# Patient Record
Sex: Female | Born: 1955 | Hispanic: Refuse to answer | Marital: Single | Smoking: Never smoker
Health system: Southern US, Community
[De-identification: ages and names within clinical notes are randomized; demographics above are authoritative.]

## PROBLEM LIST (undated history)

## (undated) DIAGNOSIS — E785 Hyperlipidemia, unspecified: Secondary | ICD-10-CM

## (undated) DIAGNOSIS — M67479 Ganglion, unspecified ankle and foot: Secondary | ICD-10-CM

## (undated) DIAGNOSIS — Z98811 Dental restoration status: Secondary | ICD-10-CM

## (undated) DIAGNOSIS — M8580 Other specified disorders of bone density and structure, unspecified site: Secondary | ICD-10-CM

## (undated) DIAGNOSIS — I739 Peripheral vascular disease, unspecified: Secondary | ICD-10-CM

## (undated) HISTORY — PX: TONSILLECTOMY AND ADENOIDECTOMY: SUR1326

## (undated) HISTORY — DX: Other specified disorders of bone density and structure, unspecified site: M85.80

## (undated) HISTORY — PX: TOE SURGERY: SHX1073

## (undated) HISTORY — PX: PELVIC LAPAROSCOPY: SHX162

---

## 1992-01-23 HISTORY — PX: MYOMECTOMY: SHX85

## 1997-07-23 ENCOUNTER — Ambulatory Visit (HOSPITAL_COMMUNITY): Admission: RE | Admit: 1997-07-23 | Discharge: 1997-07-23 | Payer: Self-pay | Admitting: Obstetrics and Gynecology

## 1999-10-05 ENCOUNTER — Ambulatory Visit (HOSPITAL_COMMUNITY): Admission: RE | Admit: 1999-10-05 | Discharge: 1999-10-05 | Payer: Self-pay | Admitting: Gynecology

## 1999-10-05 ENCOUNTER — Other Ambulatory Visit: Admission: RE | Admit: 1999-10-05 | Discharge: 1999-10-05 | Payer: Self-pay | Admitting: Gynecology

## 1999-10-05 ENCOUNTER — Encounter: Payer: Self-pay | Admitting: Gynecology

## 2004-11-24 ENCOUNTER — Other Ambulatory Visit: Admission: RE | Admit: 2004-11-24 | Discharge: 2004-11-24 | Payer: Self-pay | Admitting: Obstetrics and Gynecology

## 2004-11-24 ENCOUNTER — Ambulatory Visit (HOSPITAL_COMMUNITY): Admission: RE | Admit: 2004-11-24 | Discharge: 2004-11-24 | Payer: Self-pay | Admitting: Obstetrics and Gynecology

## 2007-01-14 ENCOUNTER — Other Ambulatory Visit: Admission: RE | Admit: 2007-01-14 | Discharge: 2007-01-14 | Payer: Self-pay | Admitting: Obstetrics and Gynecology

## 2008-09-09 ENCOUNTER — Ambulatory Visit (HOSPITAL_COMMUNITY): Admission: RE | Admit: 2008-09-09 | Discharge: 2008-09-09 | Payer: Self-pay | Admitting: General Surgery

## 2008-09-30 ENCOUNTER — Ambulatory Visit: Payer: Self-pay | Admitting: Obstetrics and Gynecology

## 2008-09-30 ENCOUNTER — Encounter: Payer: Self-pay | Admitting: Obstetrics and Gynecology

## 2008-09-30 ENCOUNTER — Other Ambulatory Visit: Admission: RE | Admit: 2008-09-30 | Discharge: 2008-09-30 | Payer: Self-pay | Admitting: Obstetrics and Gynecology

## 2008-10-19 ENCOUNTER — Ambulatory Visit: Payer: Self-pay | Admitting: Gynecology

## 2010-02-13 ENCOUNTER — Encounter: Payer: Self-pay | Admitting: Obstetrics and Gynecology

## 2010-04-06 ENCOUNTER — Ambulatory Visit (HOSPITAL_COMMUNITY)
Admission: RE | Admit: 2010-04-06 | Discharge: 2010-04-06 | Disposition: A | Discharge status: Home / Self Care | Payer: BC Managed Care – PPO | Source: Ambulatory Visit | Attending: Obstetrics and Gynecology | Admitting: Obstetrics and Gynecology

## 2010-04-06 ENCOUNTER — Other Ambulatory Visit: Payer: Self-pay | Admitting: Obstetrics and Gynecology

## 2010-04-06 DIAGNOSIS — Z1231 Encounter for screening mammogram for malignant neoplasm of breast: Secondary | ICD-10-CM

## 2010-04-21 ENCOUNTER — Encounter (INDEPENDENT_AMBULATORY_CARE_PROVIDER_SITE_OTHER): Payer: BC Managed Care – PPO | Admitting: Obstetrics and Gynecology

## 2010-04-21 ENCOUNTER — Other Ambulatory Visit: Payer: Self-pay | Admitting: Obstetrics and Gynecology

## 2010-04-21 ENCOUNTER — Other Ambulatory Visit (HOSPITAL_COMMUNITY)
Admission: RE | Admit: 2010-04-21 | Discharge: 2010-04-21 | Disposition: A | Discharge status: Home / Self Care | Payer: BC Managed Care – PPO | Source: Ambulatory Visit | Attending: Obstetrics and Gynecology | Admitting: Obstetrics and Gynecology

## 2010-04-21 DIAGNOSIS — Z01419 Encounter for gynecological examination (general) (routine) without abnormal findings: Secondary | ICD-10-CM

## 2010-04-21 DIAGNOSIS — Z124 Encounter for screening for malignant neoplasm of cervix: Secondary | ICD-10-CM | POA: Insufficient documentation

## 2010-08-21 ENCOUNTER — Telehealth: Payer: Self-pay

## 2010-08-21 NOTE — Telephone Encounter (Signed)
Tell patient that she should take her Provera every month. I would like her to started tomorrow which is August 1. She should let me know she has abnormal bleeding again.

## 2010-08-21 NOTE — Telephone Encounter (Signed)
SEE NURSE NOTE OF 06/30/10. PT. HAS NOT TAKEN PROVERA RX SINCE MAY 2012. STARTED LIGHT SPOTTING 08/19/10 TO LIGHT FLOW TODAY ON HER OWN. WANTS TO KNOW IF SHE STILL NEEDS TO TAKE PROVERA AT THE BEGINNING OF AUGUST? FYI STATES SHE JUST FORGOT TO TAKE IT IN July. WHEN SHE WAS DUE TO.

## 2010-08-22 NOTE — Telephone Encounter (Signed)
PT. NOTIFIED OF NOTE FROM DR. GOTTSEGEN.

## 2010-09-18 ENCOUNTER — Telehealth: Payer: Self-pay

## 2010-09-18 NOTE — Telephone Encounter (Signed)
PLEASE REFER TO TELEPHONE ENCOUNTER OF 08/21/10. PT STATES SHE STARTED BLEEDING AGAIN ON HER ON 09/15/10 AFTER OUR OFFICE CLOSED & IS CONTINUING ON THRU TODAY. THIS TIME IT IS A MILD FLOW.SHE IS DUE TO TAKE PROGESTERONE AGAIN 09/23/10 & YOU WANTED HER TO CALL IF SHE HAD ANYMORE ABNORMAL BLEEDING. DID PROGESTERONE RX LAST MONTH 08/23/10 FOR APPROXIMATELY 10 DAYS.

## 2010-09-18 NOTE — Telephone Encounter (Signed)
Tell patient to stop the progesterone. I also need to see her so schedule an office visit.

## 2010-09-18 NOTE — Telephone Encounter (Signed)
Pt. Notified by work Engineer, technical sales since [employer] is full, of Dr. Timoteo Expose note below.

## 2010-10-09 ENCOUNTER — Encounter: Payer: Self-pay | Admitting: Gynecology

## 2010-10-16 ENCOUNTER — Encounter: Payer: Self-pay | Admitting: Obstetrics and Gynecology

## 2010-10-16 ENCOUNTER — Ambulatory Visit (INDEPENDENT_AMBULATORY_CARE_PROVIDER_SITE_OTHER): Payer: BC Managed Care – PPO | Admitting: Obstetrics and Gynecology

## 2010-10-16 DIAGNOSIS — N950 Postmenopausal bleeding: Secondary | ICD-10-CM

## 2010-10-16 NOTE — Progress Notes (Signed)
Patient came to see me today with abnormal postmenopausal bleeding. She has been on HRT since the beginning of the year. Initially she had monthly withdrawal period. However since late July she's had persistent spotting.  Pelvic exam: External within normal limits, BUS is within normal limits, vaginal exam within normal limits, cervix is clean, uterus is normal size and shape, adnexa is within normal limits.  Assessment: Postmenopausal bleeding on HRT, irregular  Plan: Endometrial biopsy done with significant tissue obtained. SIH scheduled.

## 2010-11-15 ENCOUNTER — Telehealth: Payer: Self-pay

## 2010-11-15 NOTE — Telephone Encounter (Signed)
I still think she should come in for [facility] GM. I am fine with her stopping HRT for the winter months.

## 2010-11-15 NOTE — Telephone Encounter (Signed)
At last office visit pt evaluated for PMB on HRT.  You recommended [facility].  Pt has that scheduled for Nov. 1 but said that she has not had any spotting since that episode and she wants to know if maybe she could skip the [facility].  Also, patient asked if it would be okay if she went off HRT for the winter months and see if she still has hotflashes.

## 2010-11-15 NOTE — Telephone Encounter (Signed)
Patient advised and she will keep her [facility] appt.

## 2010-11-23 ENCOUNTER — Ambulatory Visit (INDEPENDENT_AMBULATORY_CARE_PROVIDER_SITE_OTHER): Payer: BC Managed Care – PPO | Admitting: Obstetrics and Gynecology

## 2010-11-23 ENCOUNTER — Other Ambulatory Visit: Payer: Self-pay | Admitting: Obstetrics and Gynecology

## 2010-11-23 ENCOUNTER — Ambulatory Visit (INDEPENDENT_AMBULATORY_CARE_PROVIDER_SITE_OTHER): Payer: BC Managed Care – PPO

## 2010-11-23 DIAGNOSIS — D259 Leiomyoma of uterus, unspecified: Secondary | ICD-10-CM

## 2010-11-23 DIAGNOSIS — D219 Benign neoplasm of connective and other soft tissue, unspecified: Secondary | ICD-10-CM

## 2010-11-23 DIAGNOSIS — Z780 Asymptomatic menopausal state: Secondary | ICD-10-CM

## 2010-11-23 DIAGNOSIS — D251 Intramural leiomyoma of uterus: Secondary | ICD-10-CM

## 2010-11-23 DIAGNOSIS — N950 Postmenopausal bleeding: Secondary | ICD-10-CM

## 2010-11-23 DIAGNOSIS — N9489 Other specified conditions associated with female genital organs and menstrual cycle: Secondary | ICD-10-CM

## 2010-11-23 DIAGNOSIS — N7013 Chronic salpingitis and oophoritis: Secondary | ICD-10-CM

## 2010-11-23 DIAGNOSIS — R1904 Left lower quadrant abdominal swelling, mass and lump: Secondary | ICD-10-CM

## 2010-11-23 DIAGNOSIS — N951 Menopausal and female climacteric states: Secondary | ICD-10-CM

## 2010-11-23 NOTE — Progress Notes (Signed)
Patient came in today for an SIH. She had inappropriate postmenopausal bleeding on HRT. She is stopped her progestin for 2 months. She's had no more bleeding. She had a normal endometrial biopsy here on her last visit.  Assessment: Postmenopausal bleeding. Fibroids. Left adnexal mass. Menopausal symptoms.  Plan: We first proceeded with an ultrasound. Her uterus shows multiple fibroids. Please see report for dimensions. There is one anterior which is close to the endometrial cavity. Her endometrial echo is 5.4 mm. Her right ovary is within normal limits. Her left ovary has a thick walled cystic mass which is tubular shaped and is negative for PFD. Her cul-de-sac is free of fluid. When saline was introduced you could see compression from the anterior myoma without any intrauterine cavity defect. 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 We have discussed the findings in detail. I suspect her fibroids caused the bleeding with the stimulation from the HRT. She is currently not symptomatic. She will stop her HRT until it gets warm out. She knows when she restarts it she needs to take her progestin every month. She will return in 3 months for ultrasound of her pelvis for stability of what I think is a hydrosalpinx.

## 2011-02-28 ENCOUNTER — Telehealth: Payer: Self-pay | Admitting: *Deleted

## 2011-02-28 DIAGNOSIS — N7011 Chronic salpingitis: Secondary | ICD-10-CM

## 2011-02-28 NOTE — Telephone Encounter (Signed)
Pt called stating she is noticing that her hot flashes are coming back. She was taking estradiol 1 mg daily and provera 5 mg day 1-12 which she stopped for winter months to see if she still has hot flashes per 11/15/10 office note. Pt is possibly wanting to start back on HRT, she wanted to know if a lower dose would be need because hot flashes are not terrible bad? Pt also wanted to know if she need to take provera as well? Please advise

## 2011-02-28 NOTE — Telephone Encounter (Signed)
Take estradiol 0.5 mgm daily and Provera 5 mgm day 1-12. Start estradiol now and start Provera on march 1. Also due for follow up ultrasound. Please schedule.

## 2011-03-01 MED ORDER — MEDROXYPROGESTERONE ACETATE 5 MG PO TABS: ORAL_TABLET | ORAL | Status: DC

## 2011-03-01 NOTE — Telephone Encounter (Signed)
Left message pt to call.  °

## 2011-03-01 NOTE — Telephone Encounter (Signed)
Pt said that she still has refills left on her estradiol 1 mg she will just break in half. rx for provera 5 mg sent to pharmacy. Pt will schedule u/s as directed, order place in computer.

## 2011-03-30 ENCOUNTER — Other Ambulatory Visit: Payer: BC Managed Care – PPO

## 2011-03-30 ENCOUNTER — Ambulatory Visit: Payer: BC Managed Care – PPO | Admitting: Obstetrics and Gynecology

## 2011-04-18 ENCOUNTER — Telehealth: Payer: Self-pay | Admitting: *Deleted

## 2011-04-18 NOTE — Telephone Encounter (Signed)
Pt called to see if okay to have ultrasound while on HRT, left message on pt vm okay to keep u/s appointment.

## 2011-04-19 ENCOUNTER — Ambulatory Visit (INDEPENDENT_AMBULATORY_CARE_PROVIDER_SITE_OTHER): Payer: BC Managed Care – PPO

## 2011-04-19 ENCOUNTER — Ambulatory Visit (INDEPENDENT_AMBULATORY_CARE_PROVIDER_SITE_OTHER): Payer: BC Managed Care – PPO | Admitting: Obstetrics and Gynecology

## 2011-04-19 DIAGNOSIS — D259 Leiomyoma of uterus, unspecified: Secondary | ICD-10-CM

## 2011-04-19 DIAGNOSIS — N7013 Chronic salpingitis and oophoritis: Secondary | ICD-10-CM

## 2011-04-19 DIAGNOSIS — N83339 Acquired atrophy of ovary and fallopian tube, unspecified side: Secondary | ICD-10-CM

## 2011-04-19 DIAGNOSIS — N7011 Chronic salpingitis: Secondary | ICD-10-CM

## 2011-04-19 DIAGNOSIS — R1032 Left lower quadrant pain: Secondary | ICD-10-CM

## 2011-04-19 DIAGNOSIS — N839 Noninflammatory disorder of ovary, fallopian tube and broad ligament, unspecified: Secondary | ICD-10-CM

## 2011-04-19 DIAGNOSIS — D219 Benign neoplasm of connective and other soft tissue, unspecified: Secondary | ICD-10-CM

## 2011-04-19 NOTE — Progress Notes (Signed)
Patient came back to see me today for a followup ultrasound which was done during workup for postmenopausal bleeding. At that time she had a hydrosalpinx on her left fallopian tube which we have not seen previously. She does have occasional pain in her left lower quadrant both with and without intercourse. She has started her hormone replacement back this month due to symptoms. She has not started the  Progestin. So far she's had no abnormal bleeding. Because of a change in partners she inquired about STD testing.  Ultrasound was done. Her uterus is anteverted with numerous fibroids that are unchanged from last ultrasound. Her endometrial echo is 4.7 mm. Her right ovary is within normal limits. On the left adnexa she has a 3.3 cm tubular mass which is consistent with a hydrosalpinx. It is unchanged in size from previous ultrasound. The ovary itself appears normal. The hydrosalpinx is avascular. Her cul-de-sac is free of fluid.  Assessment: Left hydrosalpinx. Fibroids. Occasional left lower quadrant pain.  Plan: Patient reassured. Continue  HRT. Discussed laparoscopic salpingectomy if pain persists and is aggravated enough. Return in one month for annual exam. Plan STD testing then. I told her today that we probably do not need to repeat the ultrasound for one year unless she has new symptoms.

## 2011-05-14 ENCOUNTER — Other Ambulatory Visit: Payer: Self-pay | Admitting: Obstetrics and Gynecology

## 2011-05-15 ENCOUNTER — Telehealth: Payer: Self-pay | Admitting: *Deleted

## 2011-05-15 NOTE — Telephone Encounter (Signed)
Pt has multiple questions about her estradiol 1 mg tablets & about having some bleeding while taking provera 5 mg and wanted to know if you could call her at [phone].

## 2011-05-15 NOTE — Telephone Encounter (Signed)
Patient called today to discuss her HRT. She is now back on it  because of hot flashes.she does her progesterone cyclically. She started a withdrawal bleed on April 10 which is now ending. This is her first cycle. She has had an appropriate bleeding previously. We discussed her watching it and calling  If it persists. She will see me in May for her yearly visit. She was asking me about whether estrogen makes her body look more youthful and we discussed that as well.

## 2011-05-16 ENCOUNTER — Encounter: Payer: BC Managed Care – PPO | Admitting: Obstetrics and Gynecology

## 2011-06-11 ENCOUNTER — Other Ambulatory Visit: Payer: Self-pay | Admitting: Obstetrics and Gynecology

## 2011-07-03 ENCOUNTER — Encounter: Payer: BC Managed Care – PPO | Admitting: Obstetrics and Gynecology

## 2011-07-03 ENCOUNTER — Telehealth: Payer: Self-pay | Admitting: *Deleted

## 2011-07-03 NOTE — Telephone Encounter (Signed)
Pt left for the weekend trip this past weekend and forgot to take her estradiol 1 mg daily & provera 5 mg (day 1-12 of month) medication with her. She started her provera on day 3 rather than day 1 of the month, Her last day of taking provera was on last Thursday (06/28/11) she has not yet started back taking provera yet due to bleeding that happened on Saturday which was spotting that am and afternoon became heavy. Pt is now having light flow, Monday was her first day taking her estradiol since last Thursday as well. She would like to know when to start back on provera? Please advise

## 2011-07-03 NOTE — Telephone Encounter (Signed)
Continue estradiol 1 mg daily. Do not restart Provera until July 1. Let  me know if bleeding persists.

## 2011-07-03 NOTE — Telephone Encounter (Signed)
Left the below note pt voicemail.

## 2011-07-18 ENCOUNTER — Other Ambulatory Visit (HOSPITAL_COMMUNITY)
Admission: RE | Admit: 2011-07-18 | Discharge: 2011-07-18 | Disposition: A | Discharge status: Home / Self Care | Payer: BC Managed Care – PPO | Source: Ambulatory Visit | Attending: Obstetrics and Gynecology | Admitting: Obstetrics and Gynecology

## 2011-07-18 ENCOUNTER — Other Ambulatory Visit: Payer: Self-pay | Admitting: Obstetrics and Gynecology

## 2011-07-18 ENCOUNTER — Ambulatory Visit (INDEPENDENT_AMBULATORY_CARE_PROVIDER_SITE_OTHER): Payer: BC Managed Care – PPO | Admitting: Obstetrics and Gynecology

## 2011-07-18 ENCOUNTER — Encounter: Payer: Self-pay | Admitting: Obstetrics and Gynecology

## 2011-07-18 VITALS — BP 120/68 | Ht 61.5 in | Wt 124.0 lb

## 2011-07-18 DIAGNOSIS — D219 Benign neoplasm of connective and other soft tissue, unspecified: Secondary | ICD-10-CM | POA: Insufficient documentation

## 2011-07-18 DIAGNOSIS — E7800 Pure hypercholesterolemia, unspecified: Secondary | ICD-10-CM | POA: Insufficient documentation

## 2011-07-18 DIAGNOSIS — Z01419 Encounter for gynecological examination (general) (routine) without abnormal findings: Secondary | ICD-10-CM

## 2011-07-18 DIAGNOSIS — Z113 Encounter for screening for infections with a predominantly sexual mode of transmission: Secondary | ICD-10-CM

## 2011-07-18 MED ORDER — ESTRADIOL 1 MG PO TABS: 1.0000 mg | ORAL_TABLET | Freq: Every day | ORAL | Status: DC

## 2011-07-18 MED ORDER — MEDROXYPROGESTERONE ACETATE 2.5 MG PO TABS: 2.5000 mg | ORAL_TABLET | Freq: Every day | ORAL | Status: DC

## 2011-07-18 NOTE — Progress Notes (Signed)
The patient came to see me today for her annual GYN exam. She is overdue for a mammogram. She's never had a bone density. She requested STD testing because of a new partner. She is back on her HRT because of menopausal symptoms. She does not like her withdrawal bleeding as it is very heavy. She is having some vaginal dryness. She is having urinary frequency. We did an evaluation earlier this year for an appropriate postmenopausal bleeding and all was normal except for fibroid. We discussed at  that point considering every other month or every third month progestin due to the bleeding. The patient has always had normal Pap smears. Her last Pap smear was January 2012.  Physical examination:Rebekah Villanueva present. HEENT within normal limits. Neck: Thyroid not large. No masses. Supraclavicular nodes: not enlarged. Breasts: Examined in both sitting and lying  position. No skin changes and no masses. Abdomen: Soft no guarding rebound or masses or hernia. Pelvic: External: Within normal limits. BUS: Within normal limits. Vaginal:within normal limits. Good estrogen effect. No evidence of cystocele rectocele or enterocele. Cervix: clean. Uterus: Normal size and shape. Adnexa: No masses. Rectovaginal exam: Confirmatory and negative. Extremities: Within normal limits.  Assessment: #1. Menopausal symptoms #2. Atrophic vaginitis #3. Urinary frequency possibly due to early detrussor instability.  Plan: Patient to scheduled mammogram and  bone density. Switched her progestin to  Medroxyprogesterone 2.5 mg daily to suppress cycles. She knows there will be an adjustment.. If all else fails will go to more creative progestin treatment. STD testing done.The new Pap smear guidelines were discussed with the patient. Pap done at pt's request. Estrace cream samples given. One gram in vagina three times a week. She will call for prescription if happy.

## 2011-07-18 NOTE — Patient Instructions (Signed)
Schedule mammogram and bone density.

## 2011-07-19 LAB — URINALYSIS W MICROSCOPIC + REFLEX CULTURE
Bacteria, UA: NONE SEEN
Bilirubin Urine: NEGATIVE
Casts: NONE SEEN
Crystals: NONE SEEN
Glucose, UA: NEGATIVE mg/dL
Hgb urine dipstick: NEGATIVE
Ketones, ur: NEGATIVE mg/dL
Nitrite: NEGATIVE
Specific Gravity, Urine: 1.008 (ref 1.005–1.030)
pH: 5.5 (ref 5.0–8.0)

## 2011-07-19 LAB — HIV ANTIBODY (ROUTINE TESTING W REFLEX): HIV: NONREACTIVE

## 2011-07-19 LAB — HEPATITIS B SURFACE ANTIGEN: Hepatitis B Surface Ag: NEGATIVE

## 2011-07-19 LAB — GC/CHLAMYDIA PROBE AMP, GENITAL: Chlamydia, DNA Probe: NEGATIVE

## 2011-07-19 LAB — HEPATITIS C ANTIBODY: HCV Ab: NEGATIVE

## 2011-08-02 ENCOUNTER — Telehealth: Payer: Self-pay | Admitting: *Deleted

## 2011-08-02 DIAGNOSIS — Z780 Asymptomatic menopausal state: Secondary | ICD-10-CM

## 2011-08-02 NOTE — Telephone Encounter (Signed)
Pt informed of recent STD panel, copy of labs sent to pt, order placed for bone density at [facility] hospital.

## 2011-08-07 ENCOUNTER — Other Ambulatory Visit: Payer: Self-pay | Admitting: Obstetrics and Gynecology

## 2011-08-07 DIAGNOSIS — Z1231 Encounter for screening mammogram for malignant neoplasm of breast: Secondary | ICD-10-CM

## 2011-08-28 ENCOUNTER — Ambulatory Visit (HOSPITAL_COMMUNITY): Payer: BC Managed Care – PPO

## 2011-08-28 ENCOUNTER — Ambulatory Visit (HOSPITAL_COMMUNITY): Admission: RE | Admit: 2011-08-28 | Payer: BC Managed Care – PPO | Source: Ambulatory Visit

## 2011-09-21 ENCOUNTER — Ambulatory Visit (HOSPITAL_COMMUNITY)
Admission: RE | Admit: 2011-09-21 | Discharge: 2011-09-21 | Disposition: A | Discharge status: Home / Self Care | Payer: BC Managed Care – PPO | Source: Ambulatory Visit | Attending: Obstetrics and Gynecology | Admitting: Obstetrics and Gynecology

## 2011-09-21 DIAGNOSIS — Z780 Asymptomatic menopausal state: Secondary | ICD-10-CM

## 2011-09-21 DIAGNOSIS — Z1231 Encounter for screening mammogram for malignant neoplasm of breast: Secondary | ICD-10-CM

## 2011-10-04 ENCOUNTER — Telehealth: Payer: Self-pay | Admitting: *Deleted

## 2011-10-04 NOTE — Telephone Encounter (Signed)
Pt informed to take 2.5 provera daily now. Pt informed per Dr.G verbally bone density showed some bone loss but not enough to be on medication. Left the above on pt voicemail.

## 2011-10-04 NOTE — Telephone Encounter (Signed)
Left message on pt voicemail to call 

## 2011-10-04 NOTE — Telephone Encounter (Signed)
Pt is calling with a couple of question:  1. Bone density results  2. Pt was given provera 2.5 mg tablets daily given on 07/18/11 annual. Pt was cutting the 2.5 tablet in half. She thought it was the 5 mg tablet given on telephone encounter 07/03/11. Pt said you told her take 1/2 of 5 mg provera. She is not having any bleeding. She did take the correct dose this am (2.5 mg) she asked would this effect by taking this medication incorrectly?

## 2011-10-04 NOTE — Telephone Encounter (Signed)
Tell her not to worry that she was cutting the 2.5 And half. However she should take 2.5 mg daily and not cut it in  half starting now.

## 2011-11-07 ENCOUNTER — Telehealth: Payer: Self-pay | Admitting: *Deleted

## 2011-11-07 NOTE — Telephone Encounter (Signed)
Pt informed with all the below note. 

## 2011-11-07 NOTE — Telephone Encounter (Signed)
Propranolol is a beta blocker. It is used by internist and cardiologist. I do not remember ever giving it to anybody. It is not appropriate to take to help you relax. I reviewed her records and could not find where I did this. Ask her to call the drugstore and verify who  the physician is who gave it to her. The person she should  ask her for this is her PCP. Please read her this whole message.

## 2011-11-07 NOTE — Telephone Encounter (Signed)
Pt requesting medication propranolol Rx. Pt said you gave this medication to her years ago to help relax her nervous. Pt would like to learn how to swim and asked if she could have this Rx to help. Please advise

## 2011-11-21 ENCOUNTER — Other Ambulatory Visit: Payer: Self-pay | Admitting: *Deleted

## 2011-11-21 MED ORDER — ESTRADIOL 0.1 MG/GM VA CREA: TOPICAL_CREAM | VAGINAL | Status: DC

## 2011-11-21 MED ORDER — ESTRADIOL 0.1 MG/GM VA CREA: 2.0000 g | TOPICAL_CREAM | Freq: Every day | VAGINAL | Status: DC

## 2011-11-21 NOTE — Addendum Note (Signed)
Addended by: Aura Camps on: 11/21/2011 12:47 PM   Modules accepted: Orders

## 2011-11-21 NOTE — Telephone Encounter (Signed)
Pt was given samples of estrace on OV 07/18/11. Pt is calling requesting Rx for medication,should pt have same directions? 1 gram 3 times weekly?

## 2012-01-02 ENCOUNTER — Telehealth: Payer: Self-pay | Admitting: *Deleted

## 2012-01-02 NOTE — Telephone Encounter (Signed)
Pt is currently taking estradiol 1 mg daily and progesterone 2.5 daily. Most recently she has noticed some bleeding after sex very light that lasted about 2 days. Her cycles for the most part have stopped, vaginally during sex she seemed to have some irritation as well. Pt is taking estradiol vaginal cream 3 times weekly per your directions. She asked if it should be done daily? She asked if above is normal at all? Please advise

## 2012-01-02 NOTE — Telephone Encounter (Signed)
Office visit

## 2012-01-03 ENCOUNTER — Ambulatory Visit: Payer: BC Managed Care – PPO | Admitting: Obstetrics and Gynecology

## 2012-01-03 NOTE — Telephone Encounter (Signed)
Left the below on pt voicemail. 

## 2012-01-11 ENCOUNTER — Ambulatory Visit (INDEPENDENT_AMBULATORY_CARE_PROVIDER_SITE_OTHER): Payer: BC Managed Care – PPO | Admitting: Obstetrics and Gynecology

## 2012-01-11 DIAGNOSIS — N950 Postmenopausal bleeding: Secondary | ICD-10-CM

## 2012-01-11 NOTE — Patient Instructions (Signed)
We will call you with pathology report. 

## 2012-01-11 NOTE — Progress Notes (Signed)
Patient came to see me today because she's had another episode of bleeding. She has had several episodes. Most  were associated with intercourse. However she had one recently that was not. 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 She feels no lumps. Last year she had a saline infusion histogram with endometrial biopsy for inappropriate postmenopausal bleeding. She has multiple myomas that encroach on the endometrial cavity but no endometrial cavity defect. She does have a left hydrosalpinx. Endometrial biopsy last year  was normal. She is switched to continuous progestin for 6 months now. She also uses vaginal estrogen 3 times a week.  Exam: Kennon Portela present. Breasts exam done by sitting and lying and no skin changes or masses felt.Pelvic exam: External within normal limits. BUS within normal limits. Vaginal exam within normal limits. Cervix is clean without lesions. Uterus is Enlarged by myomas to approximately 8 weeks. Adnexa failed to reveal masses. Rectovaginal examination is confirmatory and without masses.   Assessment: Occasional bleeding on continuous progestin and estrogen.  Plan: Endometrial biopsy done. Uterus sounds to 9 cm. Patient reassured. If the patient continues to have bleeding she will return and consider duavee. If no more bleeding return in June.

## 2012-01-11 NOTE — Addendum Note (Signed)
Addended by: Dayna Barker on: 01/11/2012 09:04 AM   Modules accepted: Orders

## 2012-07-29 ENCOUNTER — Other Ambulatory Visit: Payer: Self-pay | Admitting: Obstetrics and Gynecology

## 2012-07-30 ENCOUNTER — Other Ambulatory Visit: Payer: Self-pay | Admitting: Obstetrics and Gynecology

## 2012-08-27 ENCOUNTER — Other Ambulatory Visit: Payer: Self-pay | Admitting: Gynecology

## 2012-08-29 ENCOUNTER — Other Ambulatory Visit: Payer: Self-pay | Admitting: Gynecology

## 2012-08-29 NOTE — Telephone Encounter (Signed)
Former patient of Dr. Timoteo Expose. Last CE 07/18/11 so she is overdue. Refill was denied as she was overdue for CE.  She called and scheduled CE but not until 10/17/12.

## 2012-08-29 NOTE — Telephone Encounter (Signed)
She needs to make sure that she follows up with her gynecological exam September 28. Also she is to be reminded that she needs to take her progestational agent Provera 2.5 mg daily that was previously prescribed. If she needs a refill of this as well please prescribe only 60 tablets with no refill

## 2012-10-17 ENCOUNTER — Ambulatory Visit (INDEPENDENT_AMBULATORY_CARE_PROVIDER_SITE_OTHER): Payer: BC Managed Care – PPO | Admitting: Gynecology

## 2012-10-17 ENCOUNTER — Encounter: Payer: Self-pay | Admitting: Gynecology

## 2012-10-17 VITALS — BP 120/78 | Ht 62.0 in | Wt 128.0 lb

## 2012-10-17 DIAGNOSIS — Z01419 Encounter for gynecological examination (general) (routine) without abnormal findings: Secondary | ICD-10-CM

## 2012-10-17 DIAGNOSIS — N951 Menopausal and female climacteric states: Secondary | ICD-10-CM

## 2012-10-17 DIAGNOSIS — M8580 Other specified disorders of bone density and structure, unspecified site: Secondary | ICD-10-CM

## 2012-10-17 DIAGNOSIS — Z79890 Hormone replacement therapy (postmenopausal): Secondary | ICD-10-CM

## 2012-10-17 DIAGNOSIS — D251 Intramural leiomyoma of uterus: Secondary | ICD-10-CM

## 2012-10-17 DIAGNOSIS — M899 Disorder of bone, unspecified: Secondary | ICD-10-CM

## 2012-10-17 LAB — CBC WITH DIFFERENTIAL/PLATELET
Basophils Absolute: 0 10*3/uL (ref 0.0–0.1)
Basophils Relative: 0 % (ref 0–1)
Eosinophils Relative: 1 % (ref 0–5)
HCT: 41.9 % (ref 36.0–46.0)
Lymphocytes Relative: 13 % (ref 12–46)
MCHC: 34.4 g/dL (ref 30.0–36.0)
MCV: 89.3 fL (ref 78.0–100.0)
Monocytes Absolute: 0.5 10*3/uL (ref 0.1–1.0)
RDW: 13.3 % (ref 11.5–15.5)

## 2012-10-17 LAB — COMPREHENSIVE METABOLIC PANEL
AST: 37 U/L (ref 0–37)
BUN: 18 mg/dL (ref 6–23)
Calcium: 10 mg/dL (ref 8.4–10.5)
Chloride: 103 mEq/L (ref 96–112)
Creat: 0.7 mg/dL (ref 0.50–1.10)
Glucose, Bld: 85 mg/dL (ref 70–99)

## 2012-10-17 MED ORDER — ESTRADIOL 0.5 MG PO TABS: 0.5000 mg | ORAL_TABLET | Freq: Every day | ORAL | Status: DC

## 2012-10-17 MED ORDER — PROGESTERONE MICRONIZED 100 MG PO CAPS: 100.0000 mg | ORAL_CAPSULE | Freq: Every day | ORAL | Status: DC

## 2012-10-17 NOTE — Patient Instructions (Signed)
Start on the new lower estrogen dose and the nightly progesterone. Call me if you have any questions at all.

## 2012-10-17 NOTE — Progress Notes (Addendum)
Rebekah Villanueva Jan 12, 1956 [id number]        57 y.o.  G2P1011 for annual exam.  Former patient of Dr. Eda Paschal. Several issues noted below.  Past medical history,surgical history, medications, allergies, family history and social history were all reviewed and documented in the EPIC chart.  ROS:  Performed and pertinent positives and negatives are included in the history, assessment and plan .  Exam: [name] Filed Vitals:   10/17/12 1116  BP: 120/78  Height: 5\' 2"  (1.575 m)  Weight: 128 lb (58.06 kg)   General appearance  Normal Skin grossly normal Head/Neck normal with no cervical or supraclavicular adenopathy thyroid normal Lungs  clear Cardiac RR, without RMG Abdominal  soft, nontender, without masses, organomegaly or hernia Breasts  examined lying and sitting without masses, retractions, discharge or axillary adenopathy. Pelvic  Ext/BUS/vagina  normal  Cervix  normal  Uterus  8 weeks size irregular consistent with leiomyoma, midline and mobile nontender   Adnexa  Without masses or tenderness    Anus and perineum  normal   Rectovaginal  normal sphincter tone without palpated masses or tenderness.    Assessment/Plan:  57 y.o. G88P1011 female for annual exam.   1. Postmenopausal on HRT. Initiated due to hot flashes. Patient takes estradiol 1 mg and Provera 2.5 mg daily. An endometrial biopsy last year for bleeding which was benign and she's done no further bleeding.  I reviewed the whole issue of HRT with her to include the WHI study with increased risk of stroke, heart attack, DVT and breast cancer. The ACOG and NAMS statements for lowest dose for the shortest period of time reviewed. Transdermal versus oral first-pass effect benefit discussed.  After lengthy discussion we both agreed to decrease her estradiol to 0.5 mg and Prometrium 100 mg nightly.  Will monitor present as long she does well then we'll continue. Not having significant vaginal dryness or dyspareunia. Patient  knows to report any vaginal bleeding. 2. Leiomyoma. Exam is stable from Dr. Verl Dicker prior exams. We'll continue to monitor with annual exams. 3. Osteopenia. DEXA 08/2011 with T score -1.6. FRAX was not done due to being on HRT. Plan repeat next year. Check vitamin D level today. Increase calcium vitamin D reviewed. 4. Mammography 08/2011. Patient knows to schedule. SBE monthly reviewed. 5. Pap smear 2013. No Pap smear done today. No history of abnormal Pap smears previously. Plan repeat Pap smear 3 year interval. 6. Colonoscopy never. Patient needs to schedule colonoscopy and agrees to arrange this in [place]. 7. Health maintenance. Is followed for elevated cholesterol by cardiologist. Baseline CBC comprehensive metabolic panel TSH vitamin D urinalysis ordered. Followup in one year, sooner as needed.   Note: This document was prepared with digital dictation and possible smart phrase technology. Any transcriptional errors that result from this process are unintentional.   Dara Lords MD, 11:48 AM 10/17/2012

## 2012-10-18 LAB — URINALYSIS W MICROSCOPIC + REFLEX CULTURE
Casts: NONE SEEN
Crystals: NONE SEEN
Ketones, ur: NEGATIVE mg/dL
Leukocytes, UA: NEGATIVE
Nitrite: NEGATIVE
Specific Gravity, Urine: 1.011 (ref 1.005–1.030)
Squamous Epithelial / LPF: NONE SEEN
pH: 6 (ref 5.0–8.0)

## 2012-10-18 LAB — VITAMIN D 25 HYDROXY (VIT D DEFICIENCY, FRACTURES): Vit D, 25-Hydroxy: 54 ng/mL (ref 30–89)

## 2012-10-24 ENCOUNTER — Telehealth: Payer: Self-pay | Admitting: *Deleted

## 2012-10-24 NOTE — Telephone Encounter (Signed)
Pt was seen 10/17/12 for annual Rx was given to change HRT progesterone 100 mg nightly and estradiol 0.5 mg. Pt said she is having issues with hair loss already and nervous about changing her HRT. Pt said in past with changing medications her body would go "crazy" so she would like stay on estradiol 1 mg for a while and take progesterone 100 mg nightly. Pt would like you opinion if you thought this would be okay? Please advise

## 2012-10-27 NOTE — Telephone Encounter (Signed)
Unable to leave message on voicemail due to no voicemail set-up.

## 2012-10-27 NOTE — Telephone Encounter (Signed)
Pt informed with the below note. 

## 2012-10-27 NOTE — Telephone Encounter (Signed)
Please see below note

## 2012-10-27 NOTE — Telephone Encounter (Signed)
That is fine 

## 2012-11-20 ENCOUNTER — Telehealth: Payer: Self-pay | Admitting: *Deleted

## 2012-11-20 MED ORDER — ESTRADIOL 1 MG PO TABS: 1.0000 mg | ORAL_TABLET | Freq: Every day | ORAL | Status: DC

## 2012-11-20 NOTE — Telephone Encounter (Signed)
Pt calling to follow up from telephone encounter 10/24/12, pt would to continue taking estradiol 1 mg, rather than decreasing to estradiol 0.5 mg. Pt said this works better. Okay switch? Please advise

## 2012-11-20 NOTE — Telephone Encounter (Signed)
rx sent,unable to leave message because mailbox full.

## 2012-11-20 NOTE — Telephone Encounter (Signed)
Okay to continue on estradiol 1 mg and Prometrium 100 mg daily

## 2012-12-22 ENCOUNTER — Other Ambulatory Visit: Payer: Self-pay | Admitting: Obstetrics and Gynecology

## 2013-01-22 HISTORY — PX: BREAST BIOPSY: SHX20

## 2013-01-29 ENCOUNTER — Other Ambulatory Visit: Payer: Self-pay | Admitting: Obstetrics and Gynecology

## 2013-03-12 ENCOUNTER — Other Ambulatory Visit: Payer: Self-pay | Admitting: Gynecology

## 2013-03-12 DIAGNOSIS — Z1231 Encounter for screening mammogram for malignant neoplasm of breast: Secondary | ICD-10-CM

## 2013-03-27 ENCOUNTER — Ambulatory Visit (HOSPITAL_COMMUNITY)
Admission: RE | Admit: 2013-03-27 | Discharge: 2013-03-27 | Disposition: A | Discharge status: Home / Self Care | Payer: BC Managed Care – PPO | Source: Ambulatory Visit | Attending: Gynecology | Admitting: Gynecology

## 2013-03-27 DIAGNOSIS — Z1231 Encounter for screening mammogram for malignant neoplasm of breast: Secondary | ICD-10-CM

## 2013-03-31 ENCOUNTER — Other Ambulatory Visit: Payer: Self-pay | Admitting: Gynecology

## 2013-03-31 DIAGNOSIS — R928 Other abnormal and inconclusive findings on diagnostic imaging of breast: Secondary | ICD-10-CM

## 2013-04-03 ENCOUNTER — Other Ambulatory Visit: Payer: Self-pay | Admitting: Gynecology

## 2013-04-03 ENCOUNTER — Ambulatory Visit
Admission: RE | Admit: 2013-04-03 | Discharge: 2013-04-03 | Disposition: A | Discharge status: Home / Self Care | Payer: Self-pay | Source: Ambulatory Visit | Attending: Gynecology | Admitting: Gynecology

## 2013-04-03 ENCOUNTER — Ambulatory Visit
Admission: RE | Admit: 2013-04-03 | Discharge: 2013-04-03 | Disposition: A | Discharge status: Home / Self Care | Payer: BC Managed Care – PPO | Source: Ambulatory Visit | Attending: Gynecology | Admitting: Gynecology

## 2013-04-03 DIAGNOSIS — R928 Other abnormal and inconclusive findings on diagnostic imaging of breast: Secondary | ICD-10-CM

## 2013-04-06 ENCOUNTER — Other Ambulatory Visit: Payer: Self-pay | Admitting: Gynecology

## 2013-04-06 ENCOUNTER — Ambulatory Visit
Admission: RE | Admit: 2013-04-06 | Discharge: 2013-04-06 | Disposition: A | Discharge status: Home / Self Care | Payer: BC Managed Care – PPO | Source: Ambulatory Visit | Attending: Gynecology | Admitting: Gynecology

## 2013-04-06 DIAGNOSIS — R928 Other abnormal and inconclusive findings on diagnostic imaging of breast: Secondary | ICD-10-CM

## 2013-04-10 ENCOUNTER — Other Ambulatory Visit: Payer: BC Managed Care – PPO

## 2013-07-27 ENCOUNTER — Other Ambulatory Visit: Payer: Self-pay | Admitting: Orthopedic Surgery

## 2013-10-19 ENCOUNTER — Other Ambulatory Visit: Payer: Self-pay | Admitting: Gynecology

## 2013-10-23 ENCOUNTER — Ambulatory Visit (INDEPENDENT_AMBULATORY_CARE_PROVIDER_SITE_OTHER): Payer: BC Managed Care – PPO | Admitting: Gynecology

## 2013-10-23 ENCOUNTER — Encounter: Payer: Self-pay | Admitting: Gynecology

## 2013-10-23 VITALS — BP 140/80 | Ht 62.0 in | Wt 131.0 lb

## 2013-10-23 DIAGNOSIS — M8580 Other specified disorders of bone density and structure, unspecified site: Secondary | ICD-10-CM

## 2013-10-23 DIAGNOSIS — D251 Intramural leiomyoma of uterus: Secondary | ICD-10-CM

## 2013-10-23 DIAGNOSIS — Z79890 Hormone replacement therapy (postmenopausal): Secondary | ICD-10-CM

## 2013-10-23 DIAGNOSIS — Z01419 Encounter for gynecological examination (general) (routine) without abnormal findings: Secondary | ICD-10-CM

## 2013-10-23 MED ORDER — ESTRADIOL 1 MG PO TABS: 1.0000 mg | ORAL_TABLET | Freq: Every day | ORAL | Status: DC

## 2013-10-23 MED ORDER — PROGESTERONE MICRONIZED 100 MG PO CAPS: ORAL_CAPSULE | ORAL | Status: DC

## 2013-10-23 NOTE — Patient Instructions (Signed)
Schedule colonoscopy with [facility] at [phone] or [facility] gastroenterology at [phone]  You may obtain a copy of any labs that were done today by logging onto MyChart as outlined in the instructions provided with your AVS (after visit summary). The office will not call with normal lab results but certainly if there are any significant abnormalities then we will contact you.   Health Maintenance, Female A healthy lifestyle and preventative care can promote health and wellness.  Maintain regular health, dental, and eye exams.  Eat a healthy diet. Foods like vegetables, fruits, whole grains, low-fat dairy products, and lean protein foods contain the nutrients you need without too many calories. Decrease your intake of foods high in solid fats, added sugars, and salt. Get information about a proper diet from your caregiver, if necessary.  Regular physical exercise is one of the most important things you can do for your health. Most adults should get at least 150 minutes of moderate-intensity exercise (any activity that increases your heart rate and causes you to sweat) each week. In addition, most adults need muscle-strengthening exercises on 2 or more days a week.   Maintain a healthy weight. The body mass index (BMI) is a screening tool to identify possible weight problems. It provides an estimate of body fat based on height and weight. Your caregiver can help determine your BMI, and can help you achieve or maintain a healthy weight. For adults 20 years and older:  A BMI below 18.5 is considered underweight.  A BMI of 18.5 to 24.9 is normal.  A BMI of 25 to 29.9 is considered overweight.  A BMI of 30 and above is considered obese.  Maintain normal blood lipids and cholesterol by exercising and minimizing your intake of saturated fat. Eat a balanced diet with plenty of fruits and vegetables. Blood tests for lipids and cholesterol should begin at age 64 and be repeated every  5 years. If your lipid or cholesterol levels are high, you are over 50, or you are a high risk for heart disease, you may need your cholesterol levels checked more frequently.Ongoing high lipid and cholesterol levels should be treated with medicines if diet and exercise are not effective.  If you smoke, find out from your caregiver how to quit. If you do not use tobacco, do not start.  Lung cancer screening is recommended for adults aged 58 80 years who are at high risk for developing lung cancer because of a history of smoking. Yearly low-dose computed tomography (CT) is recommended for people who have at least a 30-pack-year history of smoking and are a current smoker or have quit within the past 15 years. A pack year of smoking is smoking an average of 1 pack of cigarettes a day for 1 year (for example: 1 pack a day for 30 years or 2 packs a day for 15 years). Yearly screening should continue until the smoker has stopped smoking for at least 15 years. Yearly screening should also be stopped for people who develop a health problem that would prevent them from having lung cancer treatment.  If you are pregnant, do not drink alcohol. If you are breastfeeding, be very cautious about drinking alcohol. If you are not pregnant and choose to drink alcohol, do not exceed 1 drink per day. One drink is considered to be 12 ounces (355 mL) of beer, 5 ounces (148 mL) of wine, or 1.5 ounces (44 mL) of liquor.  Avoid use of street drugs. Do not share needles  with anyone. Ask for help if you need support or instructions about stopping the use of drugs.  High blood pressure causes heart disease and increases the risk of stroke. Blood pressure should be checked at least every 1 to 2 years. Ongoing high blood pressure should be treated with medicines, if weight loss and exercise are not effective.  If you are 55 to 58 years old, ask your caregiver if you should take aspirin to prevent strokes.  Diabetes screening  involves taking a blood sample to check your fasting blood sugar level. This should be done once every 3 years, after age 45, if you are within normal weight and without risk factors for diabetes. Testing should be considered at a younger age or be carried out more frequently if you are overweight and have at least 1 risk factor for diabetes.  Breast cancer screening is essential preventative care for women. You should practice "breast self-awareness." This means understanding the normal appearance and feel of your breasts and may include breast self-examination. Any changes detected, no matter how small, should be reported to a caregiver. Women in their 20s and 30s should have a clinical breast exam (CBE) by a caregiver as part of a regular health exam every 1 to 3 years. After age 40, women should have a CBE every year. Starting at age 40, women should consider having a mammogram (breast X-ray) every year. Women who have a family history of breast cancer should talk to their caregiver about genetic screening. Women at a high risk of breast cancer should talk to their caregiver about having an MRI and a mammogram every year.  Breast cancer gene (BRCA)-related cancer risk assessment is recommended for women who have family members with BRCA-related cancers. BRCA-related cancers include breast, ovarian, tubal, and peritoneal cancers. Having family members with these cancers may be associated with an increased risk for harmful changes (mutations) in the breast cancer genes BRCA1 and BRCA2. Results of the assessment will determine the need for genetic counseling and BRCA1 and BRCA2 testing.  The Pap test is a screening test for cervical cancer. Women should have a Pap test starting at age 21. Between ages 21 and 29, Pap tests should be repeated every 2 years. Beginning at age 30, you should have a Pap test every 3 years as long as the past 3 Pap tests have been normal. If you had a hysterectomy for a problem that  was not cancer or a condition that could lead to cancer, then you no longer need Pap tests. If you are between ages 65 and 70, and you have had normal Pap tests going back 10 years, you no longer need Pap tests. If you have had past treatment for cervical cancer or a condition that could lead to cancer, you need Pap tests and screening for cancer for at least 20 years after your treatment. If Pap tests have been discontinued, risk factors (such as a new sexual partner) need to be reassessed to determine if screening should be resumed. Some women have medical problems that increase the chance of getting cervical cancer. In these cases, your caregiver may recommend more frequent screening and Pap tests.  The human papillomavirus (HPV) test is an additional test that may be used for cervical cancer screening. The HPV test looks for the virus that can cause the cell changes on the cervix. The cells collected during the Pap test can be tested for HPV. The HPV test could be used to screen women aged   30 years and older, and should be used in women of any age who have unclear Pap test results. After the age of 30, women should have HPV testing at the same frequency as a Pap test.  Colorectal cancer can be detected and often prevented. Most routine colorectal cancer screening begins at the age of 50 and continues through age 75. However, your caregiver may recommend screening at an earlier age if you have risk factors for colon cancer. On a yearly basis, your caregiver may provide home test kits to check for hidden blood in the stool. Use of a small camera at the end of a tube, to directly examine the colon (sigmoidoscopy or colonoscopy), can detect the earliest forms of colorectal cancer. Talk to your caregiver about this at age 50, when routine screening begins. Direct examination of the colon should be repeated every 5 to 10 years through age 75, unless early forms of pre-cancerous polyps or small growths are  found.  Hepatitis C blood testing is recommended for all people born from 1945 through 1965 and any individual with known risks for hepatitis C.  Practice safe sex. Use condoms and avoid high-risk sexual practices to reduce the spread of sexually transmitted infections (STIs). Sexually active women aged 25 and younger should be checked for Chlamydia, which is a common sexually transmitted infection. Older women with new or multiple partners should also be tested for Chlamydia. Testing for other STIs is recommended if you are sexually active and at increased risk.  Osteoporosis is a disease in which the bones lose minerals and strength with aging. This can result in serious bone fractures. The risk of osteoporosis can be identified using a bone density scan. Women ages 65 and over and women at risk for fractures or osteoporosis should discuss screening with their caregivers. Ask your caregiver whether you should be taking a calcium supplement or vitamin D to reduce the rate of osteoporosis.  Menopause can be associated with physical symptoms and risks. Hormone replacement therapy is available to decrease symptoms and risks. You should talk to your caregiver about whether hormone replacement therapy is right for you.  Use sunscreen. Apply sunscreen liberally and repeatedly throughout the day. You should seek shade when your shadow is shorter than you. Protect yourself by wearing long sleeves, pants, a wide-brimmed hat, and sunglasses year round, whenever you are outdoors.  Notify your caregiver of new moles or changes in moles, especially if there is a change in shape or color. Also notify your caregiver if a mole is larger than the size of a pencil eraser.  Stay current with your immunizations. Document Released: 07/24/2010 Document Revised: 05/05/2012 Document Reviewed: 07/24/2010 [employer] Patient Information 2014 [employer].   

## 2013-10-23 NOTE — Progress Notes (Signed)
Rebekah Villanueva August 25, 1955 [id number]        58 y.o.  G2P1011 for annual exam.  Several issues noted below.  Past medical history,surgical history, problem list, medications, allergies, family history and social history were all reviewed and documented as reviewed in the EPIC chart.  ROS:  12 system ROS performed with pertinent positives and negatives included in the history, assessment and plan.   Additional significant findings :  none   Exam: Kim Counsellor Vitals:   10/23/13 1042  BP: 140/80  Height: 5\' 2"  (1.575 m)  Weight: 131 lb (59.421 kg)   General appearance:  Normal affect, orientation and appearance. Skin: Grossly normal HEENT: Without gross lesions.  No cervical or supraclavicular adenopathy. Thyroid normal.  Lungs:  Clear without wheezing, rales or rhonchi Cardiac: RR, without RMG Abdominal:  Soft, nontender, without masses, guarding, rebound, organomegaly or hernia Breasts:  Examined lying and sitting without masses, retractions, discharge or axillary adenopathy. Pelvic:  Ext/BUS/vagina with atrophic changes  Cervix with atrophic changes  Uterus 8 weeks size, midline and mobile nontender consistent with her history of leiomyoma  Adnexa  Without masses or tenderness    Anus and perineum  Normal   Rectovaginal  Normal sphincter tone without palpated masses or tenderness.    Assessment/Plan:  58 y.o. G16P1011 female for annual exam .   1. Postmenopausal/atrophic genital changes/HRT. Patient continues on Estrace 1 mg and Prometrium 100 mg daily. Is doing well with this. No bleeding.  I again reviewed the whole issue of HRT with her to include the WHI study with increased risk of stroke, heart attack, DVT and breast cancer. The ACOG and NAMS statements for lowest dose for the shortest period of time reviewed. Transdermal versus oral first-pass effect benefit discussed.  Patient is going to decide about weaning this winter. I refilled her both x1 year in the event she  decides against this. She did try last year but had unacceptable hot flashes and sweats. 2. Leiomyoma. Uterus approximately 8 weeks' size stable from prior exams. Will continue to monitor. 3. Osteopenia. DEXA 08/2011 T score -1.6. FRAX not done due to HRT. Repeat DEXA now patient will schedule.  Check vitamin D level today. 4. Colonoscopy never. I have recommended this in the past and she has not scheduled that yet. I again recommended screening colonoscopy reviewing the benefits of early detection and precancerous polyp removal. Patient promises to schedule this coming year. 5. Mammography 03/2013. Continue with annual mammography. SBE monthly reviewed. 6. Pap smear 2013. No Pap smear done today. No history of significant abnormal Pap smears. Plan repeat Pap smear next year at 3 year interval. 7. Health maintenance. Mild elevated blood pressure at 140/80 reviewed. I asked the patient to have it rechecked in a non-exam situation. If remains elevated she will need to see her primary physician.  Patient actively sees a cardiologist who follows her cholesterol. Has not had routine blood work done otherwise. Check CBC comprehensive metabolic panel urinalysis vitamin D TSH. Follow up for DEXA otherwise annually, sooner if any issues     [name] MD, 11:18 AM 10/23/2013

## 2013-10-24 LAB — URINALYSIS W MICROSCOPIC + REFLEX CULTURE
Bacteria, UA: NONE SEEN
Bilirubin Urine: NEGATIVE
Casts: NONE SEEN
Crystals: NONE SEEN
GLUCOSE, UA: NEGATIVE mg/dL
HGB URINE DIPSTICK: NEGATIVE
Ketones, ur: NEGATIVE mg/dL
Leukocytes, UA: NEGATIVE
NITRITE: NEGATIVE
PROTEIN: NEGATIVE mg/dL
Specific Gravity, Urine: <1.005 — ABNORMAL LOW (ref 1.005–1.030)
Squamous Epithelial / LPF: NONE SEEN
Urobilinogen, UA: 0.2 mg/dL (ref 0.0–1.0)
pH: 6.5 (ref 5.0–8.0)

## 2013-11-23 ENCOUNTER — Encounter: Payer: Self-pay | Admitting: Gynecology

## 2013-12-11 ENCOUNTER — Telehealth: Payer: Self-pay | Admitting: *Deleted

## 2013-12-11 DIAGNOSIS — Z113 Encounter for screening for infections with a predominantly sexual mode of transmission: Secondary | ICD-10-CM

## 2013-12-11 NOTE — Telephone Encounter (Signed)
Okay to do CBC comprehensive metabolic panel lipid profile urinalysis TSH vitamin D HIV hepatitis B hepatitis C RPR. I did not do a GC and chlamydia screen at her visit. If she wants this done I can do when she comes in for her DEXA but I will need to examine her.

## 2013-12-11 NOTE — Telephone Encounter (Signed)
Pt informed with the below note, pt said she will wait for GC and chlamydia. Orders placed.

## 2013-12-11 NOTE — Telephone Encounter (Signed)
Pt had annual on 10/23/13 left without having labs drawn pt will coming on dec 7th for dexa. She is going to have labs drawn that day asked if STD and HIV could be added to blood work? Please advise

## 2013-12-30 ENCOUNTER — Other Ambulatory Visit: Payer: Self-pay | Admitting: Gynecology

## 2014-01-05 ENCOUNTER — Encounter (HOSPITAL_BASED_OUTPATIENT_CLINIC_OR_DEPARTMENT_OTHER): Admission: RE | Payer: Self-pay | Source: Ambulatory Visit

## 2014-01-05 ENCOUNTER — Ambulatory Visit (HOSPITAL_BASED_OUTPATIENT_CLINIC_OR_DEPARTMENT_OTHER)
Admission: RE | Admit: 2014-01-05 | Payer: BC Managed Care – PPO | Source: Ambulatory Visit | Admitting: Orthopedic Surgery

## 2014-01-05 SURGERY — CARPAL TUNNEL RELEASE
Anesthesia: Regional | Laterality: Right

## 2014-01-22 DIAGNOSIS — M8580 Other specified disorders of bone density and structure, unspecified site: Secondary | ICD-10-CM

## 2014-01-22 HISTORY — DX: Other specified disorders of bone density and structure, unspecified site: M85.80

## 2014-02-15 ENCOUNTER — Other Ambulatory Visit: Payer: BLUE CROSS/BLUE SHIELD

## 2014-02-15 ENCOUNTER — Other Ambulatory Visit: Payer: Self-pay | Admitting: Gynecology

## 2014-02-15 ENCOUNTER — Ambulatory Visit (INDEPENDENT_AMBULATORY_CARE_PROVIDER_SITE_OTHER): Payer: BLUE CROSS/BLUE SHIELD

## 2014-02-15 DIAGNOSIS — M8580 Other specified disorders of bone density and structure, unspecified site: Secondary | ICD-10-CM

## 2014-02-15 DIAGNOSIS — Z13820 Encounter for screening for osteoporosis: Secondary | ICD-10-CM

## 2014-02-15 DIAGNOSIS — Z113 Encounter for screening for infections with a predominantly sexual mode of transmission: Secondary | ICD-10-CM

## 2014-02-15 LAB — CBC WITH DIFFERENTIAL/PLATELET
Basophils Absolute: 0 10*3/uL (ref 0.0–0.1)
Basophils Relative: 0 % (ref 0–1)
EOS ABS: 0.2 10*3/uL (ref 0.0–0.7)
EOS PCT: 2 % (ref 0–5)
HEMATOCRIT: 37.9 % (ref 36.0–46.0)
Hemoglobin: 13.1 g/dL (ref 12.0–15.0)
LYMPHS PCT: 18 % (ref 12–46)
Lymphs Abs: 1.4 10*3/uL (ref 0.7–4.0)
MCH: 31.1 pg (ref 26.0–34.0)
MCHC: 34.6 g/dL (ref 30.0–36.0)
MCV: 90 fL (ref 78.0–100.0)
MPV: 10.3 fL (ref 8.6–12.4)
Monocytes Absolute: 0.5 10*3/uL (ref 0.1–1.0)
Monocytes Relative: 7 % (ref 3–12)
NEUTROS ABS: 5.7 10*3/uL (ref 1.7–7.7)
NEUTROS PCT: 73 % (ref 43–77)
PLATELETS: 231 10*3/uL (ref 150–400)
RBC: 4.21 MIL/uL (ref 3.87–5.11)
RDW: 13 % (ref 11.5–15.5)
WBC: 7.8 10*3/uL (ref 4.0–10.5)

## 2014-02-15 LAB — COMPREHENSIVE METABOLIC PANEL
ALT: 19 U/L (ref 0–35)
AST: 20 U/L (ref 0–37)
Albumin: 4 g/dL (ref 3.5–5.2)
Alkaline Phosphatase: 54 U/L (ref 39–117)
BUN: 24 mg/dL — AB (ref 6–23)
CHLORIDE: 104 meq/L (ref 96–112)
CO2: 26 mEq/L (ref 19–32)
Calcium: 9.5 mg/dL (ref 8.4–10.5)
Creat: 0.81 mg/dL (ref 0.50–1.10)
Glucose, Bld: 89 mg/dL (ref 70–99)
POTASSIUM: 4.5 meq/L (ref 3.5–5.3)
SODIUM: 141 meq/L (ref 135–145)
Total Bilirubin: 0.4 mg/dL (ref 0.2–1.2)
Total Protein: 6 g/dL (ref 6.0–8.3)

## 2014-02-15 LAB — HIV ANTIBODY (ROUTINE TESTING W REFLEX): HIV 1&2 Ab, 4th Generation: NONREACTIVE

## 2014-02-15 LAB — HEPATITIS C ANTIBODY: HCV Ab: NEGATIVE

## 2014-02-15 LAB — HEPATITIS B SURFACE ANTIGEN: HEP B S AG: NEGATIVE

## 2014-02-15 LAB — TSH: TSH: 0.809 u[IU]/mL (ref 0.350–4.500)

## 2014-02-16 ENCOUNTER — Encounter: Payer: Self-pay | Admitting: Gynecology

## 2014-02-16 LAB — URINALYSIS W MICROSCOPIC + REFLEX CULTURE
BACTERIA UA: NONE SEEN
BILIRUBIN URINE: NEGATIVE
Casts: NONE SEEN
Crystals: NONE SEEN
Glucose, UA: NEGATIVE mg/dL
Hgb urine dipstick: NEGATIVE
KETONES UR: NEGATIVE mg/dL
LEUKOCYTES UA: NEGATIVE
Nitrite: NEGATIVE
Protein, ur: NEGATIVE mg/dL
Specific Gravity, Urine: 1.007 (ref 1.005–1.030)
Squamous Epithelial / LPF: NONE SEEN
Urobilinogen, UA: 0.2 mg/dL (ref 0.0–1.0)
pH: 5.5 (ref 5.0–8.0)

## 2014-02-16 LAB — VITAMIN D 25 HYDROXY (VIT D DEFICIENCY, FRACTURES): Vit D, 25-Hydroxy: 29 ng/mL — ABNORMAL LOW (ref 30–100)

## 2014-02-16 LAB — RPR

## 2014-04-20 ENCOUNTER — Other Ambulatory Visit: Payer: Self-pay

## 2014-04-20 DIAGNOSIS — Z1231 Encounter for screening mammogram for malignant neoplasm of breast: Secondary | ICD-10-CM

## 2014-05-31 ENCOUNTER — Ambulatory Visit
Admission: RE | Admit: 2014-05-31 | Discharge: 2014-05-31 | Disposition: A | Discharge status: Home / Self Care | Payer: BLUE CROSS/BLUE SHIELD | Source: Ambulatory Visit

## 2014-05-31 DIAGNOSIS — Z1231 Encounter for screening mammogram for malignant neoplasm of breast: Secondary | ICD-10-CM

## 2014-07-08 ENCOUNTER — Other Ambulatory Visit: Payer: Self-pay | Admitting: Orthopedic Surgery

## 2014-08-06 ENCOUNTER — Other Ambulatory Visit: Payer: Self-pay | Admitting: Orthopedic Surgery

## 2014-08-19 ENCOUNTER — Encounter (HOSPITAL_BASED_OUTPATIENT_CLINIC_OR_DEPARTMENT_OTHER): Payer: Self-pay | Admitting: *Deleted

## 2014-08-24 ENCOUNTER — Encounter (HOSPITAL_BASED_OUTPATIENT_CLINIC_OR_DEPARTMENT_OTHER): Payer: Self-pay | Admitting: Orthopedic Surgery

## 2014-08-24 ENCOUNTER — Ambulatory Visit (HOSPITAL_BASED_OUTPATIENT_CLINIC_OR_DEPARTMENT_OTHER)
Admission: RE | Admit: 2014-08-24 | Discharge: 2014-08-24 | Disposition: A | Discharge status: Home / Self Care | Payer: BLUE CROSS/BLUE SHIELD | Source: Ambulatory Visit | Attending: Orthopedic Surgery | Admitting: Orthopedic Surgery

## 2014-08-24 ENCOUNTER — Encounter (HOSPITAL_BASED_OUTPATIENT_CLINIC_OR_DEPARTMENT_OTHER)
Admission: RE | Disposition: A | Discharge status: Home / Self Care | Payer: Self-pay | Source: Ambulatory Visit | Attending: Orthopedic Surgery

## 2014-08-24 ENCOUNTER — Ambulatory Visit (HOSPITAL_BASED_OUTPATIENT_CLINIC_OR_DEPARTMENT_OTHER): Payer: BLUE CROSS/BLUE SHIELD | Admitting: Certified Registered"

## 2014-08-24 DIAGNOSIS — G5601 Carpal tunnel syndrome, right upper limb: Secondary | ICD-10-CM | POA: Insufficient documentation

## 2014-08-24 HISTORY — PX: CARPAL TUNNEL RELEASE: SHX101

## 2014-08-24 LAB — POCT HEMOGLOBIN-HEMACUE: Hemoglobin: 13.3 g/dL (ref 12.0–15.0)

## 2014-08-24 SURGERY — CARPAL TUNNEL RELEASE
Anesthesia: Regional | Site: Wrist | Laterality: Right

## 2014-08-24 MED ORDER — MIDAZOLAM HCL 5 MG/5ML IJ SOLN
INTRAMUSCULAR | Status: DC | PRN
  Administered 2014-08-24: 2 mg via INTRAVENOUS

## 2014-08-24 MED ORDER — CEFAZOLIN SODIUM-DEXTROSE 2-3 GM-% IV SOLR: 2.0000 g | INTRAVENOUS | Status: DC

## 2014-08-24 MED ORDER — TRAMADOL HCL 50 MG PO TABS: 50.0000 mg | ORAL_TABLET | Freq: Four times a day (QID) | ORAL | Status: DC | PRN

## 2014-08-24 MED ORDER — CHLORHEXIDINE GLUCONATE 4 % EX LIQD: 60.0000 mL | Freq: Once | CUTANEOUS | Status: DC

## 2014-08-24 MED ORDER — PROPOFOL INFUSION 10 MG/ML OPTIME
INTRAVENOUS | Status: DC | PRN
  Administered 2014-08-24: 75 ug/kg/min via INTRAVENOUS

## 2014-08-24 MED ORDER — MEPERIDINE HCL 25 MG/ML IJ SOLN: 6.2500 mg | INTRAMUSCULAR | Status: DC | PRN

## 2014-08-24 MED ORDER — CHLORHEXIDINE GLUCONATE 4 % EX LIQD
60.0000 mL | Freq: Once | CUTANEOUS | Status: DC
Start: 2014-08-24 — End: 2014-08-24

## 2014-08-24 MED ORDER — ONDANSETRON HCL 4 MG/2ML IJ SOLN
INTRAMUSCULAR | Status: DC | PRN
  Administered 2014-08-24: 4 mg via INTRAVENOUS

## 2014-08-24 MED ORDER — LIDOCAINE HCL (CARDIAC) 20 MG/ML IV SOLN
INTRAVENOUS | Status: DC | PRN
  Administered 2014-08-24: 20 mg via INTRAVENOUS

## 2014-08-24 MED ORDER — GLYCOPYRROLATE 0.2 MG/ML IJ SOLN: 0.2000 mg | Freq: Once | INTRAMUSCULAR | Status: DC | PRN

## 2014-08-24 MED ORDER — CEFAZOLIN SODIUM-DEXTROSE 2-3 GM-% IV SOLR
INTRAVENOUS | Status: AC
  Filled 2014-08-24: qty 50

## 2014-08-24 MED ORDER — ONDANSETRON HCL 4 MG/2ML IJ SOLN: 4.0000 mg | Freq: Once | INTRAMUSCULAR | Status: DC | PRN

## 2014-08-24 MED ORDER — SCOPOLAMINE 1 MG/3DAYS TD PT72: 1.0000 | MEDICATED_PATCH | Freq: Once | TRANSDERMAL | Status: DC | PRN

## 2014-08-24 MED ORDER — CEFAZOLIN SODIUM-DEXTROSE 2-3 GM-% IV SOLR
2.0000 g | INTRAVENOUS | Status: AC
  Administered 2014-08-24: 2 g via INTRAVENOUS

## 2014-08-24 MED ORDER — FENTANYL CITRATE (PF) 100 MCG/2ML IJ SOLN
INTRAMUSCULAR | Status: DC | PRN
  Administered 2014-08-24: 50 ug via INTRAVENOUS

## 2014-08-24 MED ORDER — FENTANYL CITRATE (PF) 100 MCG/2ML IJ SOLN
INTRAMUSCULAR | Status: AC
  Filled 2014-08-24: qty 2

## 2014-08-24 MED ORDER — HYDROMORPHONE HCL 1 MG/ML IJ SOLN: 0.2500 mg | INTRAMUSCULAR | Status: DC | PRN

## 2014-08-24 MED ORDER — BUPIVACAINE HCL (PF) 0.25 % IJ SOLN
INTRAMUSCULAR | Status: DC | PRN
  Administered 2014-08-24: 8 mL

## 2014-08-24 MED ORDER — LACTATED RINGERS IV SOLN
INTRAVENOUS | Status: DC
Start: 2014-08-24 — End: 2014-08-24
  Administered 2014-08-24: 08:00:00 via INTRAVENOUS

## 2014-08-24 MED ORDER — BUPIVACAINE HCL (PF) 0.25 % IJ SOLN
INTRAMUSCULAR | Status: AC
  Filled 2014-08-24: qty 90

## 2014-08-24 MED ORDER — MIDAZOLAM HCL 2 MG/2ML IJ SOLN
INTRAMUSCULAR | Status: AC
  Filled 2014-08-24: qty 2

## 2014-08-24 SURGICAL SUPPLY — 34 items
BLADE SURG 15 STRL LF DISP TIS (BLADE) ×1 IMPLANT
BLADE SURG 15 STRL SS (BLADE) ×2
BNDG CMPR 9X4 STRL LF SNTH (GAUZE/BANDAGES/DRESSINGS)
BNDG COHESIVE 3X5 TAN STRL LF (GAUZE/BANDAGES/DRESSINGS) ×2 IMPLANT
BNDG ESMARK 4X9 LF (GAUZE/BANDAGES/DRESSINGS) IMPLANT
BNDG GAUZE ELAST 4 BULKY (GAUZE/BANDAGES/DRESSINGS) ×2 IMPLANT
CHLORAPREP W/TINT 26ML (MISCELLANEOUS) ×2 IMPLANT
CORDS BIPOLAR (ELECTRODE) ×2 IMPLANT
COVER BACK TABLE 60X90IN (DRAPES) ×2 IMPLANT
COVER MAYO STAND STRL (DRAPES) ×2 IMPLANT
CUFF TOURNIQUET SINGLE 18IN (TOURNIQUET CUFF) ×2 IMPLANT
DRAPE EXTREMITY T 121X128X90 (DRAPE) ×2 IMPLANT
DRAPE SURG 17X23 STRL (DRAPES) ×2 IMPLANT
DRSG PAD ABDOMINAL 8X10 ST (GAUZE/BANDAGES/DRESSINGS) ×2 IMPLANT
GAUZE SPONGE 4X4 12PLY STRL (GAUZE/BANDAGES/DRESSINGS) ×2 IMPLANT
GAUZE XEROFORM 1X8 LF (GAUZE/BANDAGES/DRESSINGS) ×2 IMPLANT
GLOVE BIOGEL PI IND STRL 8.5 (GLOVE) ×1 IMPLANT
GLOVE BIOGEL PI INDICATOR 8.5 (GLOVE) ×1
GLOVE SURG ORTHO 8.0 STRL STRW (GLOVE) ×2 IMPLANT
GLOVE SURG SS PI 7.0 STRL IVOR (GLOVE) ×1 IMPLANT
GOWN STRL REUS W/ TWL LRG LVL3 (GOWN DISPOSABLE) ×1 IMPLANT
GOWN STRL REUS W/TWL LRG LVL3 (GOWN DISPOSABLE) ×2
GOWN STRL REUS W/TWL XL LVL3 (GOWN DISPOSABLE) ×2 IMPLANT
NDL PRECISIONGLIDE 27X1.5 (NEEDLE) IMPLANT
NEEDLE PRECISIONGLIDE 27X1.5 (NEEDLE) IMPLANT
NS IRRIG 1000ML POUR BTL (IV SOLUTION) ×2 IMPLANT
PACK BASIN DAY SURGERY FS (CUSTOM PROCEDURE TRAY) ×2 IMPLANT
STOCKINETTE 4X48 STRL (DRAPES) ×2 IMPLANT
SUT ETHILON 4 0 PS 2 18 (SUTURE) ×2 IMPLANT
SUT VICRYL 4-0 PS2 18IN ABS (SUTURE) IMPLANT
SYR BULB 3OZ (MISCELLANEOUS) ×2 IMPLANT
SYR CONTROL 10ML LL (SYRINGE) IMPLANT
TOWEL OR 17X24 6PK STRL BLUE (TOWEL DISPOSABLE) ×2 IMPLANT
UNDERPAD 30X30 (UNDERPADS AND DIAPERS) ×2 IMPLANT

## 2014-08-24 NOTE — Discharge Instructions (Addendum)

## 2014-08-24 NOTE — Transfer of Care (Signed)
Immediate Anesthesia Transfer of Care Note  Patient: Rebekah Villanueva  Procedure(s) Performed: Procedure(s) with comments:  RIGHT CARPAL TUNNEL RELEASE (Right) - REGIONAL/FAB  Patient Location: PACU  Anesthesia Type:Bier block  Level of Consciousness: awake, alert , oriented and patient cooperative  Airway & Oxygen Therapy: Patient Spontanous Breathing and Patient connected to face mask oxygen  Post-op Assessment: Report given to RN and Post -op Vital signs reviewed and stable  Post vital signs: Reviewed and stable  Last Vitals:  Filed Vitals:   08/24/14 0809  BP: 102/41  Pulse: 57  Temp: 36.6 C  Resp: 16    Complications: No apparent anesthesia complications

## 2014-08-24 NOTE — Op Note (Signed)
Dictation Number [id number]

## 2014-08-24 NOTE — Anesthesia Postprocedure Evaluation (Signed)
Anesthesia Post Note  Patient: Rebekah Villanueva  Procedure(s) Performed: Procedure(s) (LRB):  RIGHT CARPAL TUNNEL RELEASE (Right)  Anesthesia type: general  Patient location: PACU  Post pain: Pain level controlled  Post assessment: Patient's Cardiovascular Status Stable  Last Vitals:  Filed Vitals:   08/24/14 1058  BP: 102/61  Pulse: 54  Temp: 36.5 C  Resp: 20    Post vital signs: Reviewed and stable  Level of consciousness: sedated  Complications: No apparent anesthesia complications

## 2014-08-24 NOTE — Anesthesia Preprocedure Evaluation (Signed)
Anesthesia Evaluation  Patient identified by MRN, date of birth, ID band Patient awake    Reviewed: Allergy & Precautions, NPO status , Patient's Chart, lab work & pertinent test results  Airway Mallampati: I  TM Distance: >3 FB Neck ROM: Full    Dental   Pulmonary    Pulmonary exam normal       Cardiovascular Normal cardiovascular exam    Neuro/Psych    GI/Hepatic   Endo/Other    Renal/GU      Musculoskeletal   Abdominal   Peds  Hematology   Anesthesia Other Findings   Reproductive/Obstetrics                             Anesthesia Physical Anesthesia Plan  ASA: II  Anesthesia Plan: Bier Block   Post-op Pain Management:    Induction: Intravenous  Airway Management Planned: Simple Face Mask  Additional Equipment:   Intra-op Plan:   Post-operative Plan: Extubation in OR  Informed Consent: I have reviewed the patients History and Physical, chart, labs and discussed the procedure including the risks, benefits and alternatives for the proposed anesthesia with the patient or authorized representative who has indicated his/her understanding and acceptance.     Plan Discussed with: CRNA and Surgeon  Anesthesia Plan Comments:         Anesthesia Quick Evaluation

## 2014-08-24 NOTE — Op Note (Signed)
Rebekah Villanueva, DOANE               ACCOUNT NO.:  [id number]  MEDICAL RECORD NO.:  [id number]  LOCATION:                                 FACILITY:  PHYSICIAN:  Daryll Brod, M.D.            DATE OF BIRTH:  DATE OF PROCEDURE:  08/24/2014 DATE OF DISCHARGE:                              OPERATIVE REPORT   PREOPERATIVE DIAGNOSIS:  Carpal tunnel syndrome, right hand.  POSTOPERATIVE DIAGNOSIS:  Carpal tunnel syndrome, right hand.  OPERATION:  Decompression of right median nerve.  SURGEON:  Daryll Brod, M.D.  ANESTHESIA:  Forearm-based IV regional with local infiltration.  ANESTHESIOLOGIST:  Crissie Sickles. Conrad [place], M.D.  HISTORY:  The patient is a 59 year old female with a history of carpal tunnel syndrome, nerve conduction is positive. This has not responded to conservative treatment.  She has elected to undergo surgical decompression of the median nerve, right side.  Pre, peri, and postoperative courses have been discussed along with risks and complications.  She is aware there is no guarantee with the surgery; possibility of infection; recurrence of injury to arteries, nerves, tendons; incomplete release of symptoms; and dystrophy.  In the preoperative area, the patient is seen, the extremity was marked by both the patient and surgeon.  Antibiotic given.  PROCEDURE IN DETAIL:  The patient was brought to the operating room, where forearm-based IV regional anesthetic was carried out without difficulty.  She was prepped using ChloraPrep, supine position, right arm free.  A 3-minute dry time was allowed.  Time-out taken, confirming the patient and procedure.  A longitudinal incision was made in the right palm, carried down through subcutaneous tissue.  Bleeders were electrocauterized with bipolar.  Palmar fascia was split.  Superficial palmar arch identified.  Flexor tendon to the ring and little finger identified to the ulnar side of the median nerve.  Carpal retinaculum was incised with  sharp dissection.  Right-angle and Sewall retractors were placed between skin and forearm fascia.  The fascia released for approximately 2 cm proximal to the wrist crease under direct vision. Canal was explored.  Air compression to the nerve was apparent.  The motor branch entered into muscle distally.  No further lesions were identified.  The wound was copiously irrigated with saline and skin closed with interrupted 4-0 nylon sutures.  Local infiltration with 0.25% bupivacaine without epinephrine was given, 8 mL total was used.  A sterile compressive dressing with the fingers free was applied.  On deflation of the tourniquet, all fingers immediately pinked.  She was taken to the recovery room for observation in satisfactory condition. She will be discharged home to return to the [facility] in 1 week on tramadol.          ______________________________ Daryll Brod, M.D.     GK/MEDQ  D:  08/24/2014  T:  08/24/2014  Job:  [id number]

## 2014-08-24 NOTE — H&P (Signed)
Rebekah Villanueva  is 70 and right handed. She has had a right carpal tunnel injection done with excellent relief of symptoms. She works at [employer]. She states that the pain, numbness and tingling has returned. She has no new history of injury. She complains of an intermittent dull, throbbing, and aching type pain with numbness and tingling in the median nerve distribution. She has positive nerve conductions. The injection gave her excellent relief of symptoms but the symptoms have recurred. This is awakening her at night. Nerve conductions reveal a motor delay of 3.3 on the left and 3.8 on the right, sensory delay of 2.0 on the left and 2.4 on the right with diminution amplitudes of 38 on the right and 102 on the left. Her nerve conductions are over a year old and almost 59 years old.  These are repeated in that she desires having the left side done. These are repeated by Dr. Zebedee Iba revealing a normal motor and sensory latencies on her left side. She has had worsening of her carpal tunnel syndrome on her right side with motor delay of 4.1, sensory delay of 2.5, amplitude diminution of 38. This is better than her last ones.  PAST MEDICAL HISTORY: She is allergic to Darvocet. She is on Lipitor, vitamins, and Estradiol. She has had tonsillectomy and fibromyectomy.  FAMILY H ISTORY: Positive for diabetes, high BP and arthritis.  SOCIAL HISTORY: She does not smoke. She drinks socially.   REVIEW OF SYSTEMS: Negative for 14 points Rebekah Villanueva is an 59 y.o. female.   Chief Complaint: numbness right hand HPI: see above  Past Medical History  Diagnosis Date  . Elevated cholesterol   . Fibroid   . Osteopenia 01/2014    T score -1.9 FRAX 6.9%/0.6%    Past Surgical History  Procedure Laterality Date  . Myomectomy  1994  . Pelvic laparoscopy    . Tonsillectomy and adenoidectomy    . Cesarean section  1996    Family History  Problem Relation Age of Onset  . Diabetes Mother   .  Hypertension Mother   . Hyperlipidemia Mother   . Hyperlipidemia Sister    Social History:  reports that she has never smoked. She does not have any smokeless tobacco history on file. She reports that she drinks about 0.5 oz of alcohol per week. She reports that she does not use illicit drugs.  Allergies:  Allergies  Allergen Reactions  . Codeine Itching    No prescriptions prior to admission    No results found for this or any previous visit (from the past 48 hour(s)).  No results found.   Pertinent items are noted in HPI.  Height 5\' 2"  (1.575 m), weight 57.607 kg (127 lb).  General appearance: alert, cooperative and appears stated age Head: Normocephalic, without obvious abnormality Neck: no JVD Resp: clear to auscultation bilaterally Cardio: regular rate and rhythm, S1, S2 normal, no murmur, click, rub or gallop GI: soft, non-tender; bowel sounds normal; no masses,  no organomegaly Extremities: numbness right hand Pulses: 2+ and symmetric Skin: Skin color, texture, turgor normal. No rashes or lesions Neurologic: Grossly normal Incision/Wound: na  Assessment/Plan Dx: right carpal tunnel syndrome. She would like to have her right carpal tunnel  We will precede with surgical release. The pre, peri and post op course are discussed along with risks and complications.  She is aware there is no guarantee with surgery, possibility of infection, recurrence, injury to arteries, nerves and tendons, incomplete relief of  symptoms and dystrophy.  She is scheduled for right carpal tunnel release as an outpatient under regional anesthesia.   Rosene Pilling R 08/24/2014, 7:46 AM

## 2014-08-24 NOTE — Anesthesia Procedure Notes (Signed)
Anesthesia Regional Block:  Bier block (IV Regional)  Pre-Anesthetic Checklist: ,, timeout performed, Correct Patient, Correct Site, Correct Laterality, Correct Procedure,, site marked, surgical consent,, at surgeon's request Needles:  Injection technique: Single-shot  Needle Type: Other      Needle Gauge: 20 and 20 G    Additional Needles: Bier block (IV Regional) Narrative:   Performed by: Personally    Procedure Name: MAC Date/Time: 08/24/2014 9:45 AM Performed by: Sherlie Boyum D Pre-anesthesia Checklist: Patient identified, Emergency Drugs available, Suction available, Patient being monitored and Timeout performed Patient Re-evaluated:Patient Re-evaluated prior to inductionOxygen Delivery Method: Simple face mask

## 2014-08-24 NOTE — Brief Op Note (Signed)
08/24/2014  10:13 AM  PATIENT:  Addison Lank  59 y.o. female  PRE-OPERATIVE DIAGNOSIS:  RIGHT CARPAL TUNNEL SYNDROME  POST-OPERATIVE DIAGNOSIS:  right carpal tunnel syndrome  PROCEDURE:  Procedure(s) with comments:  RIGHT CARPAL TUNNEL RELEASE (Right) - REGIONAL/FAB  SURGEON:  Surgeon(s) and Role:    * Daryll Brod, MD - Primary  PHYSICIAN ASSISTANT:   ASSISTANTS: none   ANESTHESIA:   local and regional  EBL:  Total I/O In: 800 [I.V.:800] Out: -   BLOOD ADMINISTERED:none  DRAINS: none   LOCAL MEDICATIONS USED:  BUPIVICAINE   SPECIMEN:  No Specimen  DISPOSITION OF SPECIMEN:  N/A  COUNTS:  YES  TOURNIQUET:   Total Tourniquet Time Documented: Forearm (Right) - 17 minutes Total: Forearm (Right) - 17 minutes   DICTATION: .Other Dictation: Dictation Number [id number]  PLAN OF CARE: Discharge to home after PACU  PATIENT DISPOSITION:  PACU - hemodynamically stable.

## 2014-08-25 ENCOUNTER — Encounter (HOSPITAL_BASED_OUTPATIENT_CLINIC_OR_DEPARTMENT_OTHER): Payer: Self-pay | Admitting: Orthopedic Surgery

## 2014-10-29 ENCOUNTER — Encounter: Payer: BC Managed Care – PPO | Admitting: Gynecology

## 2014-11-25 DIAGNOSIS — I739 Peripheral vascular disease, unspecified: Secondary | ICD-10-CM | POA: Insufficient documentation

## 2014-11-25 DIAGNOSIS — E785 Hyperlipidemia, unspecified: Secondary | ICD-10-CM | POA: Insufficient documentation

## 2014-11-29 ENCOUNTER — Other Ambulatory Visit: Payer: Self-pay | Admitting: Gynecology

## 2014-12-03 ENCOUNTER — Other Ambulatory Visit (HOSPITAL_COMMUNITY)
Admission: RE | Admit: 2014-12-03 | Discharge: 2014-12-03 | Disposition: A | Discharge status: Home / Self Care | Payer: BLUE CROSS/BLUE SHIELD | Source: Ambulatory Visit | Attending: Gynecology | Admitting: Gynecology

## 2014-12-03 ENCOUNTER — Ambulatory Visit (INDEPENDENT_AMBULATORY_CARE_PROVIDER_SITE_OTHER): Payer: BLUE CROSS/BLUE SHIELD | Admitting: Gynecology

## 2014-12-03 ENCOUNTER — Encounter: Payer: Self-pay | Admitting: Gynecology

## 2014-12-03 VITALS — BP 124/70 | Ht 62.5 in | Wt 133.0 lb

## 2014-12-03 DIAGNOSIS — D251 Intramural leiomyoma of uterus: Secondary | ICD-10-CM

## 2014-12-03 DIAGNOSIS — Z1151 Encounter for screening for human papillomavirus (HPV): Secondary | ICD-10-CM | POA: Insufficient documentation

## 2014-12-03 DIAGNOSIS — Z01419 Encounter for gynecological examination (general) (routine) without abnormal findings: Secondary | ICD-10-CM | POA: Diagnosis not present

## 2014-12-03 DIAGNOSIS — Z79890 Hormone replacement therapy (postmenopausal): Secondary | ICD-10-CM

## 2014-12-03 DIAGNOSIS — L659 Nonscarring hair loss, unspecified: Secondary | ICD-10-CM

## 2014-12-03 DIAGNOSIS — M8580 Other specified disorders of bone density and structure, unspecified site: Secondary | ICD-10-CM

## 2014-12-03 DIAGNOSIS — Z113 Encounter for screening for infections with a predominantly sexual mode of transmission: Secondary | ICD-10-CM | POA: Diagnosis not present

## 2014-12-03 DIAGNOSIS — Z1321 Encounter for screening for nutritional disorder: Secondary | ICD-10-CM

## 2014-12-03 LAB — COMPREHENSIVE METABOLIC PANEL
ALT: 22 U/L (ref 6–29)
AST: 23 U/L (ref 10–35)
Albumin: 4.2 g/dL (ref 3.6–5.1)
Alkaline Phosphatase: 60 U/L (ref 33–130)
BILIRUBIN TOTAL: 0.5 mg/dL (ref 0.2–1.2)
BUN: 18 mg/dL (ref 7–25)
CALCIUM: 9.6 mg/dL (ref 8.6–10.4)
CO2: 26 mmol/L (ref 20–31)
Chloride: 102 mmol/L (ref 98–110)
Creat: 0.88 mg/dL (ref 0.50–1.05)
GLUCOSE: 82 mg/dL (ref 65–99)
POTASSIUM: 4.3 mmol/L (ref 3.5–5.3)
Sodium: 140 mmol/L (ref 135–146)
Total Protein: 6.5 g/dL (ref 6.1–8.1)

## 2014-12-03 LAB — THYROID PANEL
Free T4: 0.89 ng/dL (ref 0.80–1.80)
T3 Uptake: 27 % (ref 22–35)
T4, Total: 6.6 ug/dL (ref 4.5–12.0)
TSH: 0.738 u[IU]/mL (ref 0.350–4.500)

## 2014-12-03 LAB — HEMOGLOBIN A1C
Hgb A1c MFr Bld: 5.5 % (ref <5.7)
MEAN PLASMA GLUCOSE: 111 mg/dL (ref <117)

## 2014-12-03 MED ORDER — PROGESTERONE MICRONIZED 100 MG PO CAPS: 100.0000 mg | ORAL_CAPSULE | Freq: Every day | ORAL | Status: DC

## 2014-12-03 MED ORDER — ESTRADIOL 0.1 MG/GM VA CREA: 1.0000 | TOPICAL_CREAM | VAGINAL | Status: DC

## 2014-12-03 MED ORDER — ESTRADIOL 1 MG PO TABS: ORAL_TABLET | ORAL | Status: DC

## 2014-12-03 NOTE — Addendum Note (Signed)
Addended by: Nelva Nay on: 12/03/2014 10:24 AM   Modules accepted: Orders

## 2014-12-03 NOTE — Patient Instructions (Signed)

## 2014-12-03 NOTE — Progress Notes (Signed)
Rebekah Villanueva [birth year] [id number]        59 y.o.  G2P1011  No LMP recorded. Patient is postmenopausal. for annual exam.  Several issues noted below.  Past medical history,surgical history, problem list, medications, allergies, family history and social history were all reviewed and documented as reviewed in the EPIC chart.  ROS:  Performed with pertinent positives and negatives included in the history, assessment and plan.   Additional significant findings :  none   Exam: Kim Counsellor Vitals:   12/03/14 0931  BP: 124/70  Height: 5' 2.5" (1.588 m)  Weight: 133 lb (60.328 kg)   General appearance:  Normal affect, orientation and appearance. Skin: Grossly normal HEENT: Without gross lesions.  No cervical or supraclavicular adenopathy. Thyroid normal.  Lungs:  Clear without wheezing, rales or rhonchi Cardiac: RR, without RMG Abdominal:  Soft, nontender, without masses, guarding, rebound, organomegaly or hernia Breasts:  Examined lying and sitting without masses, retractions, discharge or axillary adenopathy. Pelvic:  Ext/BUS/vagina with atrophic changes  Cervix normal.  Pap smear/HPV  Uterus anteverted, 8 weeks' size, midline and mobile nontender   Adnexa  Without masses or tenderness    Anus and perineum  Normal   Rectovaginal  Normal sphincter tone without palpated masses or tenderness.    Assessment/Plan:  59 y.o. G37P1011 female for annual exam.   1. Postmenopausal/atrophic genital changes/HRT. Patient decreased her estradiol to 0.5 mg and doing well with this. She wants to continue on HRT. I again reviewed the ACOG and NAMS statements for lowest dose for shortest period of time. Risks including stroke heart attack DVT and breast cancer reviewed.  Estradiol 1 mg, one half tablet daily at her preference and Prometrium 100 mg nightly. Patient also notes vaginal dryness. Had been on estradiol vaginal cream but stopped this and would like to restart. Estradiol vaginal cream  quarter applicator twice weekly prescribed 1 year.  Reports no vaginal bleeding and knows to do so if it occurs. 2. Transient hair loss when she decreased her estradiol dose. Appears to be improving. Will check thyroid panel for completeness. Monitor assuming negative. 3. History of leiomyoma.  Stable at 8 weeks size from prior exams. We'll continue to monitor with annual exams. Asymptomatic to the patient. 4. Requests HIV screening. No known exposure but feels more comfortable doing this. HIV ordered. 5. Osteopenia.  DEXA 2016 T score -1.9 FRAX 6.9%/0.6%. Check vitamin D level today. Repeat DEXA in 2 years. 6. Mammography 05/2014. Continue with annual mammography when due. SBE monthly reviewed. 7. Colonoscopy 2016. Repeat at their recommended interval. 8. Health maintenance. Recently had cholesterol and glucose screening. Reports glucose mildly elevated historically. We'll check baseline, comprehensive metabolic panel, hemoglobin A1c. Follow up in one year, sooner as needed.   [name] MD, 9:59 AM 12/03/2014

## 2014-12-04 LAB — URINALYSIS W MICROSCOPIC + REFLEX CULTURE
BILIRUBIN URINE: NEGATIVE
Bacteria, UA: NONE SEEN [HPF]
Casts: NONE SEEN [LPF]
Crystals: NONE SEEN [HPF]
Glucose, UA: NEGATIVE
Hgb urine dipstick: NEGATIVE
Ketones, ur: NEGATIVE
LEUKOCYTES UA: NEGATIVE
Nitrite: NEGATIVE
Protein, ur: NEGATIVE
SPECIFIC GRAVITY, URINE: 1.021 (ref 1.001–1.035)
Squamous Epithelial / LPF: NONE SEEN [HPF] (ref <=5)
WBC UA: NONE SEEN WBC/HPF (ref <=5)
Yeast: NONE SEEN [HPF]
pH: 5.5 (ref 5.0–8.0)

## 2014-12-04 LAB — VITAMIN D 25 HYDROXY (VIT D DEFICIENCY, FRACTURES): VIT D 25 HYDROXY: 41 ng/mL (ref 30–100)

## 2014-12-04 LAB — HIV ANTIBODY (ROUTINE TESTING W REFLEX): HIV 1&2 Ab, 4th Generation: NONREACTIVE

## 2014-12-05 LAB — URINE CULTURE
Colony Count: NO GROWTH
ORGANISM ID, BACTERIA: NO GROWTH

## 2014-12-06 LAB — CYTOLOGY - PAP

## 2014-12-20 ENCOUNTER — Telehealth: Payer: Self-pay | Admitting: *Deleted

## 2014-12-20 MED ORDER — ESTRADIOL 1 MG PO TABS: 1.0000 mg | ORAL_TABLET | Freq: Every day | ORAL | Status: DC

## 2014-12-20 NOTE — Telephone Encounter (Signed)
Directions changed, Rx sent

## 2014-12-20 NOTE — Telephone Encounter (Signed)
Okay to change estradiol to 1 mg daily prescription

## 2014-12-20 NOTE — Telephone Encounter (Signed)
Pt was prescribed estradiol 1 mg half tablet daily #90 on OV 12/03/14, pt said she picked Rx up at pharmacy and they only gave her #15 tablet due to the directions. Pt said she sometimes takes 1 whole tablet and some days she takes half a tablet. She asked if directions could be changed to estradiol 1mg  daily so she can get the correct amount of tablets. Please advise

## 2015-06-17 DIAGNOSIS — G5602 Carpal tunnel syndrome, left upper limb: Secondary | ICD-10-CM | POA: Diagnosis not present

## 2015-06-22 DIAGNOSIS — G5602 Carpal tunnel syndrome, left upper limb: Secondary | ICD-10-CM | POA: Diagnosis not present

## 2015-06-23 HISTORY — PX: CARPAL TUNNEL RELEASE: SHX101

## 2015-07-01 DIAGNOSIS — G5602 Carpal tunnel syndrome, left upper limb: Secondary | ICD-10-CM | POA: Diagnosis not present

## 2015-07-01 DIAGNOSIS — M65322 Trigger finger, left index finger: Secondary | ICD-10-CM | POA: Diagnosis not present

## 2015-07-01 DIAGNOSIS — M65321 Trigger finger, right index finger: Secondary | ICD-10-CM | POA: Diagnosis not present

## 2015-07-06 DIAGNOSIS — E785 Hyperlipidemia, unspecified: Secondary | ICD-10-CM | POA: Diagnosis not present

## 2015-07-13 ENCOUNTER — Other Ambulatory Visit: Payer: Self-pay | Admitting: Gynecology

## 2015-07-14 DIAGNOSIS — D509 Iron deficiency anemia, unspecified: Secondary | ICD-10-CM | POA: Insufficient documentation

## 2015-07-14 DIAGNOSIS — I739 Peripheral vascular disease, unspecified: Secondary | ICD-10-CM | POA: Diagnosis not present

## 2015-07-14 DIAGNOSIS — D508 Other iron deficiency anemias: Secondary | ICD-10-CM | POA: Diagnosis not present

## 2015-07-14 DIAGNOSIS — E782 Mixed hyperlipidemia: Secondary | ICD-10-CM | POA: Diagnosis not present

## 2015-07-14 DIAGNOSIS — Z79899 Other long term (current) drug therapy: Secondary | ICD-10-CM | POA: Diagnosis not present

## 2015-07-14 DIAGNOSIS — R911 Solitary pulmonary nodule: Secondary | ICD-10-CM | POA: Insufficient documentation

## 2015-07-15 DIAGNOSIS — R7303 Prediabetes: Secondary | ICD-10-CM | POA: Diagnosis not present

## 2015-07-15 DIAGNOSIS — R5381 Other malaise: Secondary | ICD-10-CM | POA: Diagnosis not present

## 2015-07-15 DIAGNOSIS — D508 Other iron deficiency anemias: Secondary | ICD-10-CM | POA: Diagnosis not present

## 2015-07-15 DIAGNOSIS — R5383 Other fatigue: Secondary | ICD-10-CM | POA: Diagnosis not present

## 2015-07-15 DIAGNOSIS — E782 Mixed hyperlipidemia: Secondary | ICD-10-CM | POA: Diagnosis not present

## 2015-07-20 DIAGNOSIS — R918 Other nonspecific abnormal finding of lung field: Secondary | ICD-10-CM | POA: Diagnosis not present

## 2015-07-20 DIAGNOSIS — R911 Solitary pulmonary nodule: Secondary | ICD-10-CM | POA: Diagnosis not present

## 2015-07-20 DIAGNOSIS — I700 Atherosclerosis of aorta: Secondary | ICD-10-CM | POA: Diagnosis not present

## 2015-07-27 DIAGNOSIS — M25512 Pain in left shoulder: Secondary | ICD-10-CM | POA: Diagnosis not present

## 2015-07-27 DIAGNOSIS — M25511 Pain in right shoulder: Secondary | ICD-10-CM | POA: Diagnosis not present

## 2015-07-27 DIAGNOSIS — M542 Cervicalgia: Secondary | ICD-10-CM | POA: Diagnosis not present

## 2015-07-27 DIAGNOSIS — M25552 Pain in left hip: Secondary | ICD-10-CM | POA: Diagnosis not present

## 2015-07-29 DIAGNOSIS — I739 Peripheral vascular disease, unspecified: Secondary | ICD-10-CM | POA: Diagnosis not present

## 2015-07-29 DIAGNOSIS — R252 Cramp and spasm: Secondary | ICD-10-CM | POA: Diagnosis not present

## 2015-07-29 DIAGNOSIS — E782 Mixed hyperlipidemia: Secondary | ICD-10-CM | POA: Diagnosis not present

## 2015-08-01 DIAGNOSIS — M1712 Unilateral primary osteoarthritis, left knee: Secondary | ICD-10-CM | POA: Diagnosis not present

## 2015-08-02 ENCOUNTER — Other Ambulatory Visit: Payer: Self-pay | Admitting: Internal Medicine

## 2015-08-02 DIAGNOSIS — M79605 Pain in left leg: Secondary | ICD-10-CM

## 2015-08-05 ENCOUNTER — Ambulatory Visit
Admission: RE | Admit: 2015-08-05 | Discharge: 2015-08-05 | Disposition: A | Discharge status: Home / Self Care | Payer: BLUE CROSS/BLUE SHIELD | Source: Ambulatory Visit | Attending: Internal Medicine | Admitting: Internal Medicine

## 2015-08-05 DIAGNOSIS — M79605 Pain in left leg: Secondary | ICD-10-CM

## 2015-08-08 ENCOUNTER — Other Ambulatory Visit: Payer: Self-pay | Admitting: Internal Medicine

## 2015-08-08 DIAGNOSIS — M79605 Pain in left leg: Secondary | ICD-10-CM

## 2015-08-08 DIAGNOSIS — D2371 Other benign neoplasm of skin of right lower limb, including hip: Secondary | ICD-10-CM | POA: Diagnosis not present

## 2015-08-08 DIAGNOSIS — I872 Venous insufficiency (chronic) (peripheral): Secondary | ICD-10-CM

## 2015-08-09 ENCOUNTER — Other Ambulatory Visit: Payer: Self-pay | Admitting: Gynecology

## 2015-08-09 DIAGNOSIS — Z1231 Encounter for screening mammogram for malignant neoplasm of breast: Secondary | ICD-10-CM

## 2015-08-11 ENCOUNTER — Ambulatory Visit
Admission: RE | Admit: 2015-08-11 | Discharge: 2015-08-11 | Disposition: A | Discharge status: Home / Self Care | Payer: BLUE CROSS/BLUE SHIELD | Source: Ambulatory Visit | Attending: Internal Medicine | Admitting: Internal Medicine

## 2015-08-11 DIAGNOSIS — I872 Venous insufficiency (chronic) (peripheral): Secondary | ICD-10-CM

## 2015-08-11 DIAGNOSIS — M79605 Pain in left leg: Secondary | ICD-10-CM | POA: Diagnosis not present

## 2015-08-11 NOTE — Consult Note (Signed)
Chief Complaint: Patient was seen in consultation today for  Chief Complaint  Patient presents with  . Venous Insufficiency    Consult for Venous insufficiency of LLE    at the request of Grisso,Greg A.  Referring Physician(s): Grisso,Greg A.  History of Present Illness: Rebekah Villanueva is a 59 y.o. female with a 12 month history of intermittent mild left lower extremity aching pain which occurs between the knee and the ankle. The pain will sometimes happen late in the day, and occasionally when she is in bed trying to fall asleep. She finds it hard to pinpoint the exact location of the pain. It is not particularly troublesome and occurs only a few times per month.  She has noted no exacerbating or alleviating factors. She denies swelling, edema, cough, shortness of breath, chest pain or other systemic symptoms.  Past Medical History  Diagnosis Date  . Elevated cholesterol   . Fibroid   . Osteopenia 01/2014    T score -1.9 FRAX 6.9%/0.6%  . PVD (peripheral vascular disease) ([place])   . Anemia   . Hyperlipidemia   . Goiter, nontoxic, multinodular   . Prediabetes     Past Surgical History  Procedure Laterality Date  . Myomectomy  1994  . Pelvic laparoscopy    . Tonsillectomy and adenoidectomy    . Cesarean section  1996  . Carpal tunnel release Right 08/24/2014    Procedure:  RIGHT CARPAL TUNNEL RELEASE;  Surgeon: Daryll Brod, MD;  Location: [facility];  Service: Orthopedics;  Laterality: Right;  REGIONAL/FAB    Allergies: Codeine  Medications: Prior to Admission medications   Medication Sig Start Date End Date Taking? Authorizing Provider  atorvastatin (LIPITOR) 20 MG tablet Take 1 tablet by mouth daily. Takes 1/2 tab po daily. 10/20/13  Yes Historical Provider, MD  Calcium Carbonate (CALCIUM 600 PO) Take 1 tablet by mouth daily.   Yes Historical Provider, MD  estradiol (ESTRACE VAGINAL) 0.1 MG/GM vaginal cream Place 1 Applicatorful vaginally 2 (two) times  a week. 12/03/14  Yes [name], MD  estradiol (ESTRACE) 1 MG tablet TAKE ONE TABLET BY MOUTH DAILY 07/13/15  Yes [name], MD  Multiple Vitamin (MULTIVITAMIN) tablet Take 1 tablet by mouth daily.     Yes Historical Provider, MD  Multiple Vitamins-Minerals (HAIR SKIN AND NAILS FORMULA) TABS Take 1 tablet by mouth.   Yes Historical Provider, MD  Omega-3 Fatty Acids (OMEGA 3 PO) Take by mouth.   Yes Historical Provider, MD  progesterone (PROMETRIUM) 100 MG capsule Take 1 capsule (100 mg total) by mouth at bedtime. 12/03/14  Yes [name], MD     Family History  Problem Relation Age of Onset  . Diabetes Mother   . Hypertension Mother   . Hyperlipidemia Mother   . Hyperlipidemia Sister     Social History   Social History  . Marital Status: Single    Spouse Name: N/A  . Number of Children: N/A  . Years of Education: N/A   Social History Main Topics  . Smoking status: Never Smoker   . Smokeless tobacco: Not on file  . Alcohol Use: 0.6 oz/week    1 Standard drinks or equivalent per week  . Drug Use: No  . Sexual Activity: Yes    Birth Control/ Protection: Post-menopausal     Comment: 1st intercourse 60 yo-More than 5 partners   Other Topics Concern  . Not on file   Social History Narrative  Review of Systems: A 12 point ROS discussed and pertinent positives are indicated in the HPI above.  All other systems are negative.  Review of Systems  Vital Signs: BP 116/62 mmHg  Pulse 70  Temp(Src) 98 F (36.7 C) (Oral)  Resp 14  Ht 5\' 2"  (1.575 m)  Wt 128 lb (58.06 kg)  BMI 23.41 kg/m2  SpO2 100%  Physical Exam  Constitutional: She is oriented to person, place, and time. She appears well-developed and well-nourished. No distress.  HENT:  Head: Normocephalic and atraumatic.  Eyes: No scleral icterus.  Cardiovascular: Normal rate and regular rhythm.   Pulmonary/Chest: Effort normal.  Abdominal: Soft. She exhibits no distension. There is no  tenderness.  Neurological: She is alert and oriented to person, place, and time.  Skin: Skin is warm and dry.  NO changes of chronic venous insufficiency  Psychiatric: She has a normal mood and affect. Her behavior is normal.  Nursing note and vitals reviewed.    Imaging: US Venous Img Lower Unilateral Left  08/11/2015  CLINICAL DATA:  60 year old female with intermittent left lower extremity aching EXAM: LEFT LOWER EXTREMITY VENOUS DUPLEX ULTRASOUND TECHNIQUE: Gray-scale sonography with graded compression, as well as color Doppler and duplex ultrasound, were performed to evaluate the deep and superficial veins of both lower extremities. Spectral Doppler was utilized to evaluate flow at rest and with distal augmentation maneuvers. A complete superficial venous insufficiency exam was performed in the upright standing position. I personally performed the technical portion of the exam. COMPARISON:  None. FINDINGS: Deep Venous System: Evaluation of the deep venous system including the common femoral, femoral, profunda femoral, popliteal and calf veins (where visible) demonstrate no evidence of deep venous thrombosis. The vessels are compressible and demonstrate normal respiratory phasicity and response to augmentation. No evidence of deep venous reflux. Superficial Venous System: SFJ: Within normal limits GSV Prox Thigh: Within normal limits GSV Mid Thigh: Within normal limits GSV Lower Thigh: Within normal limits GSV at Knee: Within normal limit GSV Prox Calf: Within normal limit GSV Mid Calf: Focal insufficiency lasting for 3.9 seconds. GSV Distal Calf: Within normal limits SPJ: Not present. The small saphenous vein continues posteriorly up the thigh. SSV Prox: Within normal limits SSV Mid: Within normal limits SSV Distal: Within normal limits Other: None. IMPRESSION: 1. Positive for focal venous insufficiency involving a short segment of the left great saphenous vein at the mid calf. The remainder of the  great saphenous vein is normal. 2. No evidence of deep venous thrombosis. Signed, Criselda Peaches, MD [facility] Electronically Signed   By: Jacqulynn Cadet M.D.   On: 08/11/2015 10:08    Labs:  CBC:  Recent Labs  08/24/14 0826  HGB 13.3    COAGS: No results for input(s): INR, APTT in the last 8760 hours.  BMP:  Recent Labs  12/03/14 1008  NA 140  K 4.3  CL 102  CO2 26  GLUCOSE 82  BUN 18  CALCIUM 9.6  CREATININE 0.88    LIVER FUNCTION TESTS:  Recent Labs  12/03/14 1008  BILITOT 0.5  AST 23  ALT 22  ALKPHOS 60  PROT 6.5  ALBUMIN 4.2    TUMOR MARKERS: No results for input(s): AFPTM, CEA, CA199, CHROMGRNA in the last 8760 hours.  Assessment and Plan:  Rebekah Villanueva's evaluation is positive for focal venous insufficiency of the left great saphenous vein in the mid calf. Anatomically this corresponds with the general location of her  symptoms.  We discussed the natural history of venous insufficiency particularly the fact that it tends to be a progressive disease although the time of progression varies tremendously between individuals from months to decades. Her disease is very focal and fairly mild. The standard therapy would be surgical grade compression hose which may help alleviate her symptoms and also prolong the natural course of progression.  She declined treatment with compression hose at this time. Her symptoms are fairly mild and only occur a few times per month. She would like to see how things progress before committing to using the hose. I explained to her that if her symptoms become more frequent, troublesome or if she starts to notice edema or changes in her skin color that she should call to make an appointment to be reevaluated.  She understands and agrees with this plan.  Thank you for this interesting consult.  I greatly enjoyed meeting ANGELEEN PUTMAN and look forward to participating  in their care.  A copy of this report was sent to the requesting provider on this date.  Electronically Signed: Jacqulynn Cadet 08/11/2015, 11:47 AM   I spent a total of  30 Minutes   in face to face in clinical consultation, greater than 50% of which was counseling/coordinating care for Left leg pain

## 2015-08-16 ENCOUNTER — Other Ambulatory Visit: Payer: BLUE CROSS/BLUE SHIELD

## 2015-09-30 ENCOUNTER — Ambulatory Visit
Admission: RE | Admit: 2015-09-30 | Discharge: 2015-09-30 | Disposition: A | Discharge status: Home / Self Care | Payer: BLUE CROSS/BLUE SHIELD | Source: Ambulatory Visit | Attending: Gynecology | Admitting: Gynecology

## 2015-09-30 DIAGNOSIS — Z1231 Encounter for screening mammogram for malignant neoplasm of breast: Secondary | ICD-10-CM | POA: Diagnosis not present

## 2015-11-04 DIAGNOSIS — M67471 Ganglion, right ankle and foot: Secondary | ICD-10-CM | POA: Diagnosis not present

## 2015-11-04 DIAGNOSIS — M79671 Pain in right foot: Secondary | ICD-10-CM | POA: Diagnosis not present

## 2015-12-02 ENCOUNTER — Telehealth: Payer: Self-pay

## 2015-12-02 NOTE — Telephone Encounter (Signed)
The Prometrium can stay the same. It does not need to be decreased.  If she does well in the half of the estrogen tablet then start taking it every other day and then every third day and then stop both. Should wean over the course of several weeks.

## 2015-12-02 NOTE — Telephone Encounter (Signed)
Patient informed. 

## 2015-12-02 NOTE — Telephone Encounter (Signed)
Patient said she is trying to decrease hormones in hopes of coming off them. She has been breaking her estradiol tab in half every morning. She said the daily Prometrium is a capsule and she cannot break it. She asked what the best way to do this is?  (Of note, has appt 12/09/15 for her annual exam with you.)

## 2015-12-09 ENCOUNTER — Encounter: Payer: BLUE CROSS/BLUE SHIELD | Admitting: Gynecology

## 2015-12-20 ENCOUNTER — Other Ambulatory Visit: Payer: Self-pay | Admitting: Gynecology

## 2016-02-17 ENCOUNTER — Encounter: Payer: Self-pay | Admitting: Gynecology

## 2016-02-17 ENCOUNTER — Ambulatory Visit (INDEPENDENT_AMBULATORY_CARE_PROVIDER_SITE_OTHER): Payer: BLUE CROSS/BLUE SHIELD | Admitting: Gynecology

## 2016-02-17 VITALS — BP 120/70 | Ht 62.0 in | Wt 135.0 lb

## 2016-02-17 DIAGNOSIS — Z01411 Encounter for gynecological examination (general) (routine) with abnormal findings: Secondary | ICD-10-CM | POA: Diagnosis not present

## 2016-02-17 DIAGNOSIS — Z79890 Hormone replacement therapy (postmenopausal): Secondary | ICD-10-CM | POA: Diagnosis not present

## 2016-02-17 DIAGNOSIS — M8580 Other specified disorders of bone density and structure, unspecified site: Secondary | ICD-10-CM

## 2016-02-17 DIAGNOSIS — N952 Postmenopausal atrophic vaginitis: Secondary | ICD-10-CM

## 2016-02-17 MED ORDER — PROGESTERONE MICRONIZED 100 MG PO CAPS: 100.0000 mg | ORAL_CAPSULE | Freq: Every day | ORAL | 4 refills | Status: DC

## 2016-02-17 MED ORDER — ESTRADIOL 1 MG PO TABS: 1.0000 mg | ORAL_TABLET | Freq: Every day | ORAL | 4 refills | Status: DC

## 2016-02-17 NOTE — Patient Instructions (Signed)

## 2016-02-17 NOTE — Progress Notes (Signed)
    Rebekah Villanueva April 14, 1955 [id number]        61 y.o.  G2P1011 for annual exam.    Past medical history,surgical history, problem list, medications, allergies, family history and social history were all reviewed and documented as reviewed in the EPIC chart.  ROS:  Performed with pertinent positives and negatives included in the history, assessment and plan.   Additional significant findings :  None   Exam: [name] assistant Vitals:   02/17/16 1029  BP: 120/70  Weight: 135 lb (61.2 kg)  Height: 5\' 2"  (1.575 m)   Body mass index is 24.69 kg/m.  General appearance:  Normal affect, orientation and appearance. Skin: Grossly normal HEENT: Without gross lesions.  No cervical or supraclavicular adenopathy. Thyroid normal.  Lungs:  Clear without wheezing, rales or rhonchi Cardiac: RR, without RMG Abdominal:  Soft, nontender, without masses, guarding, rebound, organomegaly or hernia Breasts:  Examined lying and sitting without masses, retractions, discharge or axillary adenopathy. Pelvic:  Ext, BUS, Vagina normal with mild atrophic changes  Cervix mild atrophic changes  Uterus anteverted.  Approximately 6 weeks size midline mobile nontender   Adnexa without masses or tenderness    Anus and perineum normal   Rectovaginal normal sphincter tone without palpated masses or tenderness.    Assessment/Plan:  61 y.o. G61P1011 female for annual exam.   1. Postmenopausal/atrophic genital changes/HRT. She continues on estradiol 0.5 mg and Prometrium 100 mg nightly. Doing well with this. States when she tries to stop she just does not feel as good. I reviewed the most current 2017 names guidelines on HRT. Benefits to include symptom relief, possible cardiovascular and bone health was started early versus risks to include stroke heart attack DVT and breast cancer all reviewed. At this point the patient wants to continue and I refilled her 1 year. 2. Leiomyoma. Uterus has been 8 weeks size in  the past and stable. Appears to feel a little smaller this year consistent with her menopausal advancement. Asymptomatic to the patient. Plan expectant management. 3. Osteopenia. DEXA 2016 T score -1.9 FRAX 6.9%/0.6%. Vitamin D level normal last year. Discussed repeating DEXA now versus waiting another year or 2. Patient's comfortable with waiting will plan another year or 2 given that she continues on HRT. 4. Colonoscopy 2016. Repeat at their recommended interval. 5. Mammography 09/2015. Continue with annual mammography when due. SBE monthly reviewed. 6. Pap smear/HPV 2016. No Pap smear done today. No history of significant abnormal Pap smears. 7. Health maintenance. No routine blood work done as patient does this at her primary physician's office. Follow up 1 year, sooner as needed.   [name] MD, 11:05 AM 02/17/2016

## 2016-07-16 ENCOUNTER — Other Ambulatory Visit: Payer: Self-pay | Admitting: Orthopedic Surgery

## 2016-08-20 ENCOUNTER — Encounter (HOSPITAL_BASED_OUTPATIENT_CLINIC_OR_DEPARTMENT_OTHER): Payer: Self-pay | Admitting: *Deleted

## 2016-08-21 ENCOUNTER — Telehealth: Payer: Self-pay | Admitting: *Deleted

## 2016-08-21 DIAGNOSIS — N950 Postmenopausal bleeding: Secondary | ICD-10-CM

## 2016-08-21 NOTE — Telephone Encounter (Signed)
Recommend continue on HRT for now. Watch the bleeding for now and schedule a sonohysterogram to rule out pathology such as polyps or hyperplasia.

## 2016-08-21 NOTE — Telephone Encounter (Signed)
Left a detailed message on pt voicemail per DPR access with the below, order placed for [facility]

## 2016-08-21 NOTE — Telephone Encounter (Signed)
Pt take estradiol 1 mg and progesterone 100 mg at bedtime, started bleeding yesterday am, still bleeding today, wearing mini pad changing once a day so far, states this is the first time she had bleeding with HRT.  Please advise

## 2016-08-23 ENCOUNTER — Ambulatory Visit (HOSPITAL_BASED_OUTPATIENT_CLINIC_OR_DEPARTMENT_OTHER): Payer: BLUE CROSS/BLUE SHIELD | Admitting: Anesthesiology

## 2016-08-23 ENCOUNTER — Ambulatory Visit (HOSPITAL_BASED_OUTPATIENT_CLINIC_OR_DEPARTMENT_OTHER)
Admission: RE | Admit: 2016-08-23 | Discharge: 2016-08-23 | Disposition: A | Discharge status: Home / Self Care | Payer: BLUE CROSS/BLUE SHIELD | Source: Ambulatory Visit | Attending: Orthopedic Surgery | Admitting: Orthopedic Surgery

## 2016-08-23 ENCOUNTER — Encounter (HOSPITAL_BASED_OUTPATIENT_CLINIC_OR_DEPARTMENT_OTHER): Payer: Self-pay | Admitting: Anesthesiology

## 2016-08-23 ENCOUNTER — Encounter (HOSPITAL_BASED_OUTPATIENT_CLINIC_OR_DEPARTMENT_OTHER)
Admission: RE | Disposition: A | Discharge status: Home / Self Care | Payer: Self-pay | Source: Ambulatory Visit | Attending: Orthopedic Surgery

## 2016-08-23 DIAGNOSIS — M71371 Other bursal cyst, right ankle and foot: Secondary | ICD-10-CM | POA: Diagnosis not present

## 2016-08-23 DIAGNOSIS — I739 Peripheral vascular disease, unspecified: Secondary | ICD-10-CM | POA: Insufficient documentation

## 2016-08-23 DIAGNOSIS — E7800 Pure hypercholesterolemia, unspecified: Secondary | ICD-10-CM | POA: Diagnosis not present

## 2016-08-23 DIAGNOSIS — M8580 Other specified disorders of bone density and structure, unspecified site: Secondary | ICD-10-CM | POA: Insufficient documentation

## 2016-08-23 DIAGNOSIS — R7303 Prediabetes: Secondary | ICD-10-CM | POA: Diagnosis not present

## 2016-08-23 DIAGNOSIS — M67471 Ganglion, right ankle and foot: Secondary | ICD-10-CM | POA: Diagnosis not present

## 2016-08-23 DIAGNOSIS — Z9889 Other specified postprocedural states: Secondary | ICD-10-CM

## 2016-08-23 DIAGNOSIS — E042 Nontoxic multinodular goiter: Secondary | ICD-10-CM | POA: Insufficient documentation

## 2016-08-23 DIAGNOSIS — Z79899 Other long term (current) drug therapy: Secondary | ICD-10-CM | POA: Insufficient documentation

## 2016-08-23 DIAGNOSIS — L729 Follicular cyst of the skin and subcutaneous tissue, unspecified: Secondary | ICD-10-CM | POA: Diagnosis not present

## 2016-08-23 HISTORY — PX: CYST EXCISION: SHX5701

## 2016-08-23 SURGERY — CYST REMOVAL
Anesthesia: General | Site: Foot | Laterality: Right

## 2016-08-23 MED ORDER — LIDOCAINE 2% (20 MG/ML) 5 ML SYRINGE
INTRAMUSCULAR | Status: AC
  Filled 2016-08-23: qty 5

## 2016-08-23 MED ORDER — CEFAZOLIN SODIUM-DEXTROSE 2-4 GM/100ML-% IV SOLN
2.0000 g | INTRAVENOUS | Status: AC
  Administered 2016-08-23: 2 g via INTRAVENOUS

## 2016-08-23 MED ORDER — PROMETHAZINE HCL 25 MG/ML IJ SOLN: 6.2500 mg | INTRAMUSCULAR | Status: DC | PRN

## 2016-08-23 MED ORDER — SODIUM CHLORIDE 0.9 % IV SOLN: INTRAVENOUS | Status: DC

## 2016-08-23 MED ORDER — CHLORHEXIDINE GLUCONATE 4 % EX LIQD: 60.0000 mL | Freq: Once | CUTANEOUS | Status: DC

## 2016-08-23 MED ORDER — DEXAMETHASONE SODIUM PHOSPHATE 10 MG/ML IJ SOLN
INTRAMUSCULAR | Status: AC
  Filled 2016-08-23: qty 1

## 2016-08-23 MED ORDER — FENTANYL CITRATE (PF) 100 MCG/2ML IJ SOLN
50.0000 ug | INTRAMUSCULAR | Status: DC | PRN
  Administered 2016-08-23: 100 ug via INTRAVENOUS

## 2016-08-23 MED ORDER — MIDAZOLAM HCL 2 MG/2ML IJ SOLN
1.0000 mg | INTRAMUSCULAR | Status: DC | PRN
  Administered 2016-08-23: 2 mg via INTRAVENOUS

## 2016-08-23 MED ORDER — DOCUSATE SODIUM 100 MG PO CAPS: 100.0000 mg | ORAL_CAPSULE | Freq: Two times a day (BID) | ORAL | 0 refills | Status: DC

## 2016-08-23 MED ORDER — DEXAMETHASONE SODIUM PHOSPHATE 4 MG/ML IJ SOLN
INTRAMUSCULAR | Status: DC | PRN
  Administered 2016-08-23: 10 mg via INTRAVENOUS

## 2016-08-23 MED ORDER — 0.9 % SODIUM CHLORIDE (POUR BTL) OPTIME
TOPICAL | Status: DC | PRN
  Administered 2016-08-23: 150 mL

## 2016-08-23 MED ORDER — SENNA 8.6 MG PO TABS: 2.0000 | ORAL_TABLET | Freq: Two times a day (BID) | ORAL | 0 refills | Status: DC

## 2016-08-23 MED ORDER — HYDROCODONE-ACETAMINOPHEN 5-325 MG PO TABS: 1.0000 | ORAL_TABLET | Freq: Four times a day (QID) | ORAL | 0 refills | Status: DC | PRN

## 2016-08-23 MED ORDER — CEFAZOLIN SODIUM-DEXTROSE 2-4 GM/100ML-% IV SOLN
INTRAVENOUS | Status: AC
  Filled 2016-08-23: qty 100

## 2016-08-23 MED ORDER — KETOROLAC TROMETHAMINE 30 MG/ML IJ SOLN
INTRAMUSCULAR | Status: AC
  Filled 2016-08-23: qty 1

## 2016-08-23 MED ORDER — LIDOCAINE 2% (20 MG/ML) 5 ML SYRINGE
INTRAMUSCULAR | Status: DC | PRN
  Administered 2016-08-23: 4 mg via INTRAVENOUS

## 2016-08-23 MED ORDER — PROPOFOL 10 MG/ML IV BOLUS
INTRAVENOUS | Status: DC | PRN
  Administered 2016-08-23: 200 mg via INTRAVENOUS

## 2016-08-23 MED ORDER — SCOPOLAMINE 1 MG/3DAYS TD PT72: 1.0000 | MEDICATED_PATCH | TRANSDERMAL | Status: DC

## 2016-08-23 MED ORDER — LACTATED RINGERS IV SOLN
INTRAVENOUS | Status: DC
  Administered 2016-08-23: 10:00:00 via INTRAVENOUS

## 2016-08-23 MED ORDER — KETOROLAC TROMETHAMINE 30 MG/ML IJ SOLN
INTRAMUSCULAR | Status: DC | PRN
  Administered 2016-08-23: 30 mg via INTRAVENOUS

## 2016-08-23 MED ORDER — FENTANYL CITRATE (PF) 100 MCG/2ML IJ SOLN
INTRAMUSCULAR | Status: AC
  Filled 2016-08-23: qty 2

## 2016-08-23 MED ORDER — SCOPOLAMINE 1 MG/3DAYS TD PT72: 1.0000 | MEDICATED_PATCH | Freq: Once | TRANSDERMAL | Status: DC | PRN

## 2016-08-23 MED ORDER — MIDAZOLAM HCL 2 MG/2ML IJ SOLN
INTRAMUSCULAR | Status: AC
  Filled 2016-08-23: qty 2

## 2016-08-23 MED ORDER — BUPIVACAINE HCL (PF) 0.5 % IJ SOLN
INTRAMUSCULAR | Status: AC
  Filled 2016-08-23: qty 30

## 2016-08-23 MED ORDER — BUPIVACAINE HCL (PF) 0.5 % IJ SOLN
INTRAMUSCULAR | Status: DC | PRN
  Administered 2016-08-23: 8 mL

## 2016-08-23 MED ORDER — ONDANSETRON HCL 4 MG/2ML IJ SOLN
INTRAMUSCULAR | Status: DC | PRN
  Administered 2016-08-23: 4 mg via INTRAVENOUS

## 2016-08-23 MED ORDER — HYDROMORPHONE HCL 1 MG/ML IJ SOLN: 0.2500 mg | INTRAMUSCULAR | Status: DC | PRN

## 2016-08-23 MED ORDER — ONDANSETRON HCL 4 MG/2ML IJ SOLN
INTRAMUSCULAR | Status: AC
  Filled 2016-08-23: qty 2

## 2016-08-23 SURGICAL SUPPLY — 69 items
BANDAGE ACE 4X5 VEL STRL LF (GAUZE/BANDAGES/DRESSINGS) IMPLANT
BANDAGE ESMARK 6X9 LF (GAUZE/BANDAGES/DRESSINGS) IMPLANT
BLADE ARTHRO LOK 4 BEAVER (BLADE) IMPLANT
BLADE SURG 15 STRL LF DISP TIS (BLADE) ×1 IMPLANT
BLADE SURG 15 STRL SS (BLADE) ×4
BNDG CMPR 9X4 STRL LF SNTH (GAUZE/BANDAGES/DRESSINGS) ×1
BNDG CMPR 9X6 STRL LF SNTH (GAUZE/BANDAGES/DRESSINGS)
BNDG COHESIVE 4X5 TAN STRL (GAUZE/BANDAGES/DRESSINGS) ×1 IMPLANT
BNDG COHESIVE 6X5 TAN STRL LF (GAUZE/BANDAGES/DRESSINGS) IMPLANT
BNDG CONFORM 3 STRL LF (GAUZE/BANDAGES/DRESSINGS) ×1 IMPLANT
BNDG ESMARK 4X9 LF (GAUZE/BANDAGES/DRESSINGS) ×1 IMPLANT
BNDG ESMARK 6X9 LF (GAUZE/BANDAGES/DRESSINGS)
CHLORAPREP W/TINT 26ML (MISCELLANEOUS) ×2 IMPLANT
CORD BIPOLAR FORCEPS 12FT (ELECTRODE) ×1 IMPLANT
COVER BACK TABLE 60X90IN (DRAPES) ×2 IMPLANT
CUFF TOURNIQUET SINGLE 24IN (TOURNIQUET CUFF) IMPLANT
CUFF TOURNIQUET SINGLE 34IN LL (TOURNIQUET CUFF) IMPLANT
DRAPE EXTREMITY T 121X128X90 (DRAPE) ×2 IMPLANT
DRAPE OEC MINIVIEW 54X84 (DRAPES) IMPLANT
DRAPE SURG 17X23 STRL (DRAPES) IMPLANT
DRAPE U-SHAPE 47X51 STRL (DRAPES) ×1 IMPLANT
DRSG MEPITEL 4X7.2 (GAUZE/BANDAGES/DRESSINGS) ×2 IMPLANT
DRSG PAD ABDOMINAL 8X10 ST (GAUZE/BANDAGES/DRESSINGS) ×2 IMPLANT
ELECT REM PT RETURN 9FT ADLT (ELECTROSURGICAL) ×2
ELECTRODE REM PT RTRN 9FT ADLT (ELECTROSURGICAL) ×1 IMPLANT
GAUZE SPONGE 4X4 12PLY STRL (GAUZE/BANDAGES/DRESSINGS) ×4 IMPLANT
GLOVE BIO SURGEON STRL SZ8 (GLOVE) ×2 IMPLANT
GLOVE BIOGEL PI IND STRL 7.0 (GLOVE) IMPLANT
GLOVE BIOGEL PI IND STRL 8 (GLOVE) ×2 IMPLANT
GLOVE BIOGEL PI INDICATOR 7.0 (GLOVE) ×2
GLOVE BIOGEL PI INDICATOR 8 (GLOVE) ×2
GLOVE ECLIPSE 6.5 STRL STRAW (GLOVE) ×1 IMPLANT
GLOVE ECLIPSE 8.0 STRL XLNG CF (GLOVE) ×2 IMPLANT
GOWN STRL REUS W/ TWL LRG LVL3 (GOWN DISPOSABLE) ×1 IMPLANT
GOWN STRL REUS W/ TWL XL LVL3 (GOWN DISPOSABLE) ×2 IMPLANT
GOWN STRL REUS W/TWL LRG LVL3 (GOWN DISPOSABLE) ×2
GOWN STRL REUS W/TWL XL LVL3 (GOWN DISPOSABLE) ×4
NDL HYPO 25X1 1.5 SAFETY (NEEDLE) IMPLANT
NEEDLE HYPO 22GX1.5 SAFETY (NEEDLE) IMPLANT
NEEDLE HYPO 25X1 1.5 SAFETY (NEEDLE) ×2 IMPLANT
NS IRRIG 1000ML POUR BTL (IV SOLUTION) ×2 IMPLANT
PACK BASIN DAY SURGERY FS (CUSTOM PROCEDURE TRAY) ×2 IMPLANT
PAD CAST 4YDX4 CTTN HI CHSV (CAST SUPPLIES) ×1 IMPLANT
PADDING CAST ABS 4INX4YD NS (CAST SUPPLIES)
PADDING CAST ABS COTTON 4X4 ST (CAST SUPPLIES) IMPLANT
PADDING CAST COTTON 4X4 STRL (CAST SUPPLIES) ×2
PADDING CAST COTTON 6X4 STRL (CAST SUPPLIES) IMPLANT
PENCIL BUTTON HOLSTER BLD 10FT (ELECTRODE) ×2 IMPLANT
SANITIZER HAND PURELL 535ML FO (MISCELLANEOUS) ×2 IMPLANT
SHEET MEDIUM DRAPE 40X70 STRL (DRAPES) ×2 IMPLANT
SLEEVE SCD COMPRESS KNEE MED (MISCELLANEOUS) ×2 IMPLANT
SPONGE LAP 18X18 X RAY DECT (DISPOSABLE) ×2 IMPLANT
STOCKINETTE 6  STRL (DRAPES) ×1
STOCKINETTE 6 STRL (DRAPES) ×1 IMPLANT
STRIP CLOSURE SKIN 1/2X4 (GAUZE/BANDAGES/DRESSINGS) IMPLANT
SUCTION FRAZIER HANDLE 10FR (MISCELLANEOUS) ×1
SUCTION TUBE FRAZIER 10FR DISP (MISCELLANEOUS) IMPLANT
SUT ETHILON 3 0 PS 1 (SUTURE) IMPLANT
SUT ETHILON 4 0 PS 2 18 (SUTURE) ×2 IMPLANT
SUT MNCRL AB 3-0 PS2 18 (SUTURE) IMPLANT
SUT VIC AB 0 SH 27 (SUTURE) IMPLANT
SUT VIC AB 2-0 SH 27 (SUTURE)
SUT VIC AB 2-0 SH 27XBRD (SUTURE) IMPLANT
SYR BULB 3OZ (MISCELLANEOUS) ×2 IMPLANT
SYR CONTROL 10ML LL (SYRINGE) ×1 IMPLANT
TOWEL OR 17X24 6PK STRL BLUE (TOWEL DISPOSABLE) ×3 IMPLANT
TUBE CONNECTING 20X1/4 (TUBING) ×1 IMPLANT
UNDERPAD 30X30 (UNDERPADS AND DIAPERS) ×2 IMPLANT
YANKAUER SUCT BULB TIP NO VENT (SUCTIONS) IMPLANT

## 2016-08-23 NOTE — Discharge Instructions (Addendum)
Rebekah Simmer, MD [place]  Please read the following information regarding your care after surgery.  Medications  You only need a prescription for the narcotic pain medicine (ex. oxycodone, Percocet, Norco).  All of the other medicines listed below are available over the counter. X hydrocodone as prescribed for moderate to severe pain X Aleve 2 pills twice a day for the first 3 days after surgery, with food.  Narcotic pain medicine (ex. oxycodone, Percocet, Vicodin) will cause constipation.  To prevent this problem, take the following medicines while you are taking any pain medicine. X docusate sodium (Colace) 100 mg twice a day X senna (Senokot) 2 tablets twice a day  Weight Bearing X Bear weight when you are able on your operated leg or foot in the post-op shoe.  Dressing X Keep your dressing clean and dry.  Dont put anything (coat hanger, pencil, etc) down inside of it.  If it gets damp, use a hair dryer on the cool setting to dry it.  If it gets soaked, call the office to schedule an appointment for a cast change.     After your dressing, cast or splint is removed; you may shower, but do not soak or scrub the wound.  Allow the water to run over it, and then gently pat it dry.  Swelling It is normal for you to have swelling where you had surgery.  To reduce swelling and pain, keep your toes above your nose for at least 3 days after surgery.  It may be necessary to keep your foot or leg elevated for several weeks.  If it hurts, it should be elevated.  Follow Up Call my office at [phone] when you are discharged from the hospital or surgery center to schedule an appointment to be seen two weeks after surgery.  Call my office at [phone] if you develop a fever >101.5 F, nausea, vomiting, bleeding from the surgical site or severe pain.     Post Anesthesia Home Care Instructions  Activity: Get plenty of rest for the remainder of the day. A responsible  individual must stay with you for 24 hours following the procedure.  For the next 24 hours, DO NOT: -Drive a car -Paediatric nurse -Drink alcoholic beverages -Take any medication unless instructed by your physician -Make any legal decisions or sign important papers.  Meals: Start with liquid foods such as gelatin or soup. Progress to regular foods as tolerated. Avoid greasy, spicy, heavy foods. If nausea and/or vomiting occur, drink only clear liquids until the nausea and/or vomiting subsides. Call your physician if vomiting continues.  Special Instructions/Symptoms: Your throat may feel dry or sore from the anesthesia or the breathing tube placed in your throat during surgery. If this causes discomfort, gargle with warm salt water. The discomfort should disappear within 24 hours.  If you had a scopolamine patch placed behind your ear for the management of post- operative nausea and/or vomiting:  1. The medication in the patch is effective for 72 hours, after which it should be removed.  Wrap patch in a tissue and discard in the trash. Wash hands thoroughly with soap and water. 2. You may remove the patch earlier than 72 hours if you experience unpleasant side effects which may include dry mouth, dizziness or visual disturbances. 3. Avoid touching the patch. Wash your hands with soap and water after contact with the patch.

## 2016-08-23 NOTE — Anesthesia Procedure Notes (Signed)
Procedure Name: LMA Insertion Performed by: Aleksandra Raben W Pre-anesthesia Checklist: Patient identified, Emergency Drugs available, Suction available and Patient being monitored Patient Re-evaluated:Patient Re-evaluated prior to induction Oxygen Delivery Method: Circle system utilized Preoxygenation: Pre-oxygenation with 100% oxygen Induction Type: IV induction Ventilation: Mask ventilation without difficulty LMA: LMA inserted LMA Size: 4.0 Number of attempts: 1 Placement Confirmation: positive ETCO2 Tube secured with: Tape Dental Injury: Teeth and Oropharynx as per pre-operative assessment        

## 2016-08-23 NOTE — H&P (Signed)
Rebekah Villanueva is an 61 y.o. female.   Chief Complaint: right foot pain HPI:  61 y/o female without significant PMH c/o a recurrent mass at the medial right hallux.  She has been popping it with a straight pin periodically and reports that it oozes clear gel for a few minutes and this relieves the pressure.  She denies any h/o infection.  She has failed non op treatment and presents today for excision of this mucous cyst from the right hallux.   Past Medical History:  Diagnosis Date  . Anemia   . Elevated cholesterol   . Fibroid   . Goiter, nontoxic, multinodular   . Hyperlipidemia   . Osteopenia 01/2014   T score -1.9 FRAX 6.9%/0.6%  . Prediabetes   . PVD (peripheral vascular disease) ([place])     Past Surgical History:  Procedure Laterality Date  . CARPAL TUNNEL RELEASE Right 08/24/2014   Procedure:  RIGHT CARPAL TUNNEL RELEASE;  Surgeon: Daryll Brod, MD;  Location: [facility];  Service: Orthopedics;  Laterality: Right;  REGIONAL/FAB  . CESAREAN SECTION  1996  . MYOMECTOMY  1994  . PELVIC LAPAROSCOPY    . TONSILLECTOMY AND ADENOIDECTOMY      Family History  Problem Relation Age of Onset  . Diabetes Mother   . Hypertension Mother   . Hyperlipidemia Mother   . Hyperlipidemia Sister    Social History:  reports that she has never smoked. She has never used smokeless tobacco. She reports that she drinks about 0.6 oz of alcohol per week . She reports that she does not use drugs.  Allergies:  Allergies  Allergen Reactions  . Codeine Itching    Medications Prior to Admission  Medication Sig Dispense Refill  . atorvastatin (LIPITOR) 20 MG tablet Take 1 tablet by mouth daily. Takes 1/2 tab po daily.    . Calcium Carbonate (CALCIUM 600 PO) Take 1 tablet by mouth daily.    Marland Kitchen estradiol (ESTRACE) 1 MG tablet Take 1 tablet (1 mg total) by mouth daily. 90 tablet 4  . Multiple Vitamin (MULTIVITAMIN) tablet Take 1 tablet by mouth daily.      . Multiple Vitamins-Minerals (HAIR  SKIN AND NAILS FORMULA) TABS Take 1 tablet by mouth.    . Omega-3 Fatty Acids (OMEGA 3 PO) Take by mouth.    . progesterone (PROMETRIUM) 100 MG capsule Take 1 capsule (100 mg total) by mouth at bedtime. 90 capsule 4    No results found for this or any previous visit (from the past 48 hour(s)). No results found.  ROS  No recent f/c/n/v/wt loss  Blood pressure 126/66, pulse 61, temperature 98 F (36.7 C), temperature source Oral, resp. rate 18, height 5\' 2"  (1.575 m), weight 58.2 kg (128 lb 6 oz), SpO2 100 %. Physical Exam  wn wd woman in nad.  A and O x 4.  Mood and affect normal.  EOMi.  resp unlabored.  Right hallux with a 6 mm mass at the medial aspect of the IP joint.  No signs of infectiont.  5/5 strength in PF and DF of the hallus at the MP and iP joints.  Skin o/w healthy and intact.  No lympahdenopathy.  sens to LT itnact at the toe.  Assessment/Plan R hallux mucous cyst - to OR for excision of mucous cyst and local flap coverage of the resultant defect.  The risks and benefits of the alternative treatment options have been discussed in detail.  The patient wishes to proceed  with surgery and specifically understands risks of bleeding, infection, nerve damage, blood clots, need for additional surgery, amputation and death.   Wylene Simmer, MD 09-09-2016, 9:20 AM

## 2016-08-23 NOTE — Anesthesia Postprocedure Evaluation (Signed)
Anesthesia Post Note  Patient: Rebekah Villanueva  Procedure(s) Performed: Procedure(s) (LRB): Right hallux excision of mucous cyst and local soft tissue flap rearrangement (Right)     Patient location during evaluation: PACU Anesthesia Type: General Level of consciousness: sedated Pain management: pain level controlled Vital Signs Assessment: post-procedure vital signs reviewed and stable Respiratory status: spontaneous breathing and respiratory function stable Cardiovascular status: stable Anesthetic complications: no    Last Vitals:  Vitals:   08/23/16 1044 08/23/16 1115  BP: 112/73 (!) 111/58  Pulse: (!) 57 (!) 54  Resp: 17 16  Temp:  (!) 36.4 C    Last Pain:  Vitals:   08/23/16 1115  TempSrc:   PainSc: 0-No pain                 Baine Decesare DANIEL

## 2016-08-23 NOTE — Anesthesia Preprocedure Evaluation (Signed)
Anesthesia Evaluation  Patient identified by MRN, date of birth, ID band Patient awake    Reviewed: Allergy & Precautions, NPO status , Patient's Chart, lab work & pertinent test results  History of Anesthesia Complications Negative for: history of anesthetic complications  Airway Mallampati: II  TM Distance: >3 FB Neck ROM: Full    Dental no notable dental hx. (+) Dental Advisory Given   Pulmonary neg pulmonary ROS,    Pulmonary exam normal        Cardiovascular negative cardio ROS Normal cardiovascular exam     Neuro/Psych negative neurological ROS  negative psych ROS   GI/Hepatic negative GI ROS, Neg liver ROS,   Endo/Other  negative endocrine ROS  Renal/GU negative Renal ROS  negative genitourinary   Musculoskeletal negative musculoskeletal ROS (+)   Abdominal   Peds negative pediatric ROS (+)  Hematology negative hematology ROS (+)   Anesthesia Other Findings   Reproductive/Obstetrics negative OB ROS                             Anesthesia Physical Anesthesia Plan  ASA: II  Anesthesia Plan: General   Post-op Pain Management:    Induction: Intravenous  PONV Risk Score and Plan: 3 and Ondansetron, Dexamethasone and Treatment may vary due to age or medical condition  Airway Management Planned: LMA  Additional Equipment:   Intra-op Plan:   Post-operative Plan: Extubation in OR  Informed Consent: I have reviewed the patients History and Physical, chart, labs and discussed the procedure including the risks, benefits and alternatives for the proposed anesthesia with the patient or authorized representative who has indicated his/her understanding and acceptance.   Dental advisory given  Plan Discussed with: CRNA, Anesthesiologist and Surgeon  Anesthesia Plan Comments: (Pt does not want scop patch)        Anesthesia Quick Evaluation

## 2016-08-23 NOTE — Op Note (Signed)
08/23/2016  10:34 AM  PATIENT:  Rebekah Villanueva  61 y.o. female  PRE-OPERATIVE DIAGNOSIS:  Right hallux mucous cyst  POST-OPERATIVE DIAGNOSIS:  Right hallux mucous cyst  Procedure(s): 1.  Right hallux excision of mucous cyst 2.  Right hallux local soft tissue flap rearrangement  SURGEON:  Wylene Simmer, MD  ASSISTANT: Mechele Claude, PA-C  ANESTHESIA:   General  EBL:  minimal   TOURNIQUET:   Total Tourniquet Time Documented: Leg (Right) - 11 minutes Total: Leg (Right) - 11 minutes  COMPLICATIONS:  None apparent  DISPOSITION:  Extubated, awake and stable to recovery.  PROCEDURE IN DETAIL:  After pre operative consent was obtained, and the correct operative site was identified, the patient was brought to the operating room and placed supine on the OR table.  Anesthesia was administered.  Pre-operative antibiotics were administered.  A surgical timeout was taken.  The right lower extremity was then prepped and draped in standard sterile fashion. The foot was exsanguinated, and a 4 inch Esmarch tourniquet was wrapped around the ankle. The mucous cyst was identified at the medial aspect of the hallux IP joint. An incision was marked on the skin transversely across the IP joint proximally along the glabrous border of the proximal phalanx and transversely across the hallux MP joint. Dissection was carried down through the skin and the flap was mobilized. The mucous cyst was excised in its entirety down to the IP joint. The joint was explored and was noted to have no significant degenerative changes. Wound was irrigated copiously.  The flap was then rotated distally to fill in the defect created by excision of the cyst. It was repaired with horizontal mattress sutures of 4-0 nylon. Proximally the skin was mobilized over the MP joint. This allowed closure of the proximal limb of the incision. The tourniquet was released prior to closure and hemostasis achieved. The flap was noted to be well  perfused. Sterile dressings were applied followed by a compression wrap.  Half percent Marcaine was infiltrated into the subtenon's tissue proximal to the metatarsal head for a metatarsal block for postoperative pain control. The patient was awakened from anesthesia and transported to the recovery room in stable condition  FOLLOW UP PLAN: Weightbearing as tolerated in a flat postop shoe. Follow-up in 2 weeks for suture removal.   Mechele Claude PA-C was present and scrubbed for the duration of the operative case. His assistance assistance was essential in positioning the patient, prepping and draping, gaining maintaining exposure, performing the operation, closing and dressing the wounds and applying the splint.

## 2016-08-23 NOTE — Transfer of Care (Signed)
Immediate Anesthesia Transfer of Care Note  Patient: Rebekah Villanueva  Procedure(s) Performed: Procedure(s): Right hallux excision of mucous cyst and local soft tissue flap rearrangement (Right)  Patient Location: PACU  Anesthesia Type:General  Level of Consciousness: awake and sedated  Airway & Oxygen Therapy: Patient Spontanous Breathing and Patient connected to face mask oxygen  Post-op Assessment: Report given to RN and Post -op Vital signs reviewed and stable  Post vital signs: Reviewed and stable  Last Vitals:  Vitals:   08/23/16 0854  BP: 126/66  Pulse: 61  Resp: 18  Temp: 36.7 C    Last Pain:  Vitals:   08/23/16 0854  TempSrc: Oral  PainSc: 0-No pain      Patients Stated Pain Goal: 3 (83/09/40 7680)  Complications: No apparent anesthesia complications

## 2016-08-24 ENCOUNTER — Encounter (HOSPITAL_BASED_OUTPATIENT_CLINIC_OR_DEPARTMENT_OTHER): Payer: Self-pay | Admitting: Orthopedic Surgery

## 2016-08-24 DIAGNOSIS — M79671 Pain in right foot: Secondary | ICD-10-CM | POA: Diagnosis not present

## 2016-08-30 ENCOUNTER — Telehealth: Payer: Self-pay | Admitting: *Deleted

## 2016-08-30 NOTE — Telephone Encounter (Signed)
Pt called and left message in triage voicemail asking if we had samples of progesterone 100 mg. I called pt back and got her voicemail stating we no longer have samples.

## 2016-09-06 ENCOUNTER — Telehealth: Payer: Self-pay | Admitting: *Deleted

## 2016-09-06 NOTE — Telephone Encounter (Signed)
Pt called back and stating still bleeding changing one pad per day, asked is sooner Rebekah Villanueva appointment than 09/20/16. I checked with front desk and no sooner appointment pt aware.

## 2016-09-06 NOTE — Telephone Encounter (Signed)
Pt called c/o vaginal bleeding, left message for pt to call.

## 2016-09-10 ENCOUNTER — Other Ambulatory Visit: Payer: Self-pay | Admitting: Gynecology

## 2016-09-10 DIAGNOSIS — N950 Postmenopausal bleeding: Secondary | ICD-10-CM

## 2016-09-14 ENCOUNTER — Ambulatory Visit: Payer: BLUE CROSS/BLUE SHIELD | Admitting: Cardiology

## 2016-09-20 ENCOUNTER — Ambulatory Visit (INDEPENDENT_AMBULATORY_CARE_PROVIDER_SITE_OTHER): Payer: BLUE CROSS/BLUE SHIELD | Admitting: Gynecology

## 2016-09-20 ENCOUNTER — Other Ambulatory Visit: Payer: Self-pay | Admitting: Gynecology

## 2016-09-20 ENCOUNTER — Encounter: Payer: Self-pay | Admitting: Gynecology

## 2016-09-20 ENCOUNTER — Ambulatory Visit (INDEPENDENT_AMBULATORY_CARE_PROVIDER_SITE_OTHER): Payer: BLUE CROSS/BLUE SHIELD

## 2016-09-20 VITALS — BP 118/78

## 2016-09-20 DIAGNOSIS — N950 Postmenopausal bleeding: Secondary | ICD-10-CM | POA: Diagnosis not present

## 2016-09-20 DIAGNOSIS — D251 Intramural leiomyoma of uterus: Secondary | ICD-10-CM

## 2016-09-20 DIAGNOSIS — N840 Polyp of corpus uteri: Secondary | ICD-10-CM

## 2016-09-20 DIAGNOSIS — D259 Leiomyoma of uterus, unspecified: Secondary | ICD-10-CM

## 2016-09-20 MED ORDER — MEGESTROL ACETATE 20 MG PO TABS: 20.0000 mg | ORAL_TABLET | Freq: Every day | ORAL | 1 refills | Status: DC

## 2016-09-20 NOTE — Progress Notes (Signed)
    Rebekah Villanueva [birth year] [id number]        61 y.o.  G2P1011 presents for sonohysterogram. Started bleeding approximately one month ago and has bled every day since then. Mild cramping. History of leiomyoma the past. On HRT to consist of estradiol 0.5 mg and Prometrium 100 mg nightly. Has continued during the bleeding time.  Past medical history,surgical history, problem list, medications, allergies, family history and social history were all reviewed and documented in the EPIC chart.  Directed ROS with pertinent positives and negatives documented in the history of present illness/assessment and plan.  Exam: [place] assistant Vitals:   09/20/16 1621  BP: 118/78   General appearance:  Normal Abdomen soft nontender without masses guarding rebound Pelvic external BUS vagina with light menses flow.  Cervix grossly normal with atrophic changes. Uterus enlarged consistent with history of leiomyoma. Adnexa without gross masses.  Ultrasound:  Transvaginal and transabdominal shows uterus mildly enlarged with multiple small myomas measuring 28 mm, 26 mm, 26 mm, 25 mm, 23 mm, 21 mm. Endometrial echo 6.5 mm. Right and left ovaries with numerous small scattered microcalcifications. Otherwise normal. Cul-de-sac negative.  Sonohysterogram: The cervix was visualized, cleansed with Betadine and the sonohysterogram catheter was easily introduced without difficulty. The endometrial cavity was distended with a 19 x 14 mm left posterior defect questionable polyp versus submucous myoma. An endometrial sample was taken. Patient tolerated well.  Assessment/Plan:  61 y.o. G2P1011 with postmenopausal bleeding on HRT. Sonohysterogram shows defect posteriorly although difficult on tangential views to characterize whether submucous myoma versus polyp. Reviewed pictures with the patient. Recommendations proceed with hysteroscopy D&C for removal of any intracavitary defects. Patient understands we are not removing her  leiomyoma but only those defects within the cavity. She will follow up for the biopsy results in several days and schedule her surgery. Will initiate Megace 20 mg daily to see if we can't slow/stop her bleeding. She'll continue on her estradiol 0.5 mg, stop her Prometrium and start the Megace. If bleeding continues after several days she'll increase to 20 mg twice daily until bleeding is slowed and then go back down to daily through her D&C. She also has to by could check her thyroid level which has been abnormal in the past and will go ahead and check this today.    [name] MD, 4:22 PM 09/20/2016

## 2016-09-20 NOTE — Patient Instructions (Addendum)
Follow up for biopsy results and surgery as scheduled

## 2016-09-21 LAB — TSH: TSH: 1.07 m[IU]/L

## 2016-09-25 ENCOUNTER — Telehealth: Payer: Self-pay

## 2016-09-25 NOTE — Telephone Encounter (Signed)
Patient asked if you thought it would be okay for her to wait until Christmas time to have this done so that daughter will be off work to drive her to and from?

## 2016-09-25 NOTE — Telephone Encounter (Signed)
Per Dr. Loetta Rough "Always tough question to answer is it relies on the final pathology for the definitive answer. The biopsy showed inactive endometrium with no hyperplasia or any evidence of atypia. I do not think that it is unreasonable to wait but she would have to accept in the very unusual circumstance it which show a malignancy that she could have made that diagnoses 2-3 months earlier."

## 2016-09-26 NOTE — Telephone Encounter (Signed)
I spoke with patient and relayed this to her.  She wants to consider dates and talk with her daughter. I scheduled her for 9.12.18 to hold the time for her. She will let me know asap.

## 2016-09-27 ENCOUNTER — Telehealth: Payer: Self-pay

## 2016-09-27 MED ORDER — MISOPROSTOL 200 MCG PO TABS: ORAL_TABLET | ORAL | 0 refills | Status: DC

## 2016-09-27 NOTE — Telephone Encounter (Signed)
I called patient to follow up with her to see if she worked out transportation for surgery date 10/03/16.  She said this will work. I instructed her regarding need for Cytotec tab vaginally night before surgery and Rx sent. Rosemarie Ax will be calling to schedule pre op appt.

## 2016-09-28 ENCOUNTER — Encounter: Payer: Self-pay | Admitting: Gynecology

## 2016-09-28 ENCOUNTER — Other Ambulatory Visit: Payer: Self-pay | Admitting: Gynecology

## 2016-09-28 ENCOUNTER — Ambulatory Visit: Payer: BLUE CROSS/BLUE SHIELD | Admitting: Cardiology

## 2016-09-28 ENCOUNTER — Encounter (HOSPITAL_BASED_OUTPATIENT_CLINIC_OR_DEPARTMENT_OTHER): Payer: Self-pay | Admitting: *Deleted

## 2016-09-28 ENCOUNTER — Ambulatory Visit (INDEPENDENT_AMBULATORY_CARE_PROVIDER_SITE_OTHER): Payer: BLUE CROSS/BLUE SHIELD | Admitting: Gynecology

## 2016-09-28 VITALS — BP 124/74

## 2016-09-28 DIAGNOSIS — Z01812 Encounter for preprocedural laboratory examination: Secondary | ICD-10-CM

## 2016-09-28 DIAGNOSIS — N840 Polyp of corpus uteri: Secondary | ICD-10-CM | POA: Diagnosis not present

## 2016-09-28 DIAGNOSIS — N950 Postmenopausal bleeding: Secondary | ICD-10-CM

## 2016-09-28 LAB — CBC WITH DIFFERENTIAL/PLATELET
BASOS ABS: 60 {cells}/uL (ref 0–200)
Basophils Relative: 0.9 %
EOS PCT: 1.5 %
Eosinophils Absolute: 101 cells/uL (ref 15–500)
HCT: 36.8 % (ref 35.0–45.0)
HEMOGLOBIN: 12.7 g/dL (ref 11.7–15.5)
LYMPHS ABS: 1152 {cells}/uL (ref 850–3900)
MCH: 32.1 pg (ref 27.0–33.0)
MCHC: 34.5 g/dL (ref 32.0–36.0)
MCV: 92.9 fL (ref 80.0–100.0)
MONOS PCT: 10.4 %
MPV: 10.5 fL (ref 7.5–12.5)
NEUTROS ABS: 4690 {cells}/uL (ref 1500–7800)
NEUTROS PCT: 70 %
Platelets: 289 10*3/uL (ref 140–400)
RBC: 3.96 10*6/uL (ref 3.80–5.10)
RDW: 13.1 % (ref 11.0–15.0)
Total Lymphocyte: 17.2 %
WBC mixed population: 697 cells/uL (ref 200–950)
WBC: 6.7 10*3/uL (ref 3.8–10.8)

## 2016-09-28 LAB — COMPREHENSIVE METABOLIC PANEL
AG Ratio: 1.7 (calc) (ref 1.0–2.5)
ALBUMIN MSPROF: 3.9 g/dL (ref 3.6–5.1)
ALT: 25 U/L (ref 6–29)
AST: 20 U/L (ref 10–35)
Alkaline phosphatase (APISO): 52 U/L (ref 33–130)
BUN: 16 mg/dL (ref 7–25)
CO2: 27 mmol/L (ref 20–32)
Calcium: 9.4 mg/dL (ref 8.6–10.4)
Chloride: 105 mmol/L (ref 98–110)
Creat: 0.74 mg/dL (ref 0.50–0.99)
GLUCOSE: 85 mg/dL (ref 65–99)
Globulin: 2.3 g/dL (calc) (ref 1.9–3.7)
POTASSIUM: 4.6 mmol/L (ref 3.5–5.3)
SODIUM: 140 mmol/L (ref 135–146)
TOTAL PROTEIN: 6.2 g/dL (ref 6.1–8.1)
Total Bilirubin: 0.5 mg/dL (ref 0.2–1.2)

## 2016-09-28 NOTE — Patient Instructions (Signed)
Followup for surgery as scheduled. 

## 2016-09-28 NOTE — Progress Notes (Addendum)
NPO AFTER MN W/ EXCEPTION CLEAR LIQUIDS UNTIL 0700 (NO CREAM/ MILK PRODUCTS).  ARRIVE AT 1130.  NEEDS HG.  WILL TAKE ESTRADIOL AM DOS W/ SIPS OF WATER.

## 2016-09-28 NOTE — Progress Notes (Signed)
Rebekah Villanueva Mar 11, 1955 [id number]   Preoperative consult  Chief complaint: Postmenopausal bleeding, hormone replacement therapy, endometrial polyp  History of present illness: 61 y.o. G2P1011 on HRT to consist of estradiol 0.5 mg daily and Prometrium 100 mg nightly. Started bleeding on and off over a month ago with some mild cramping. Ultrasound shows uterus mildly enlarged with multiple small myomas, endometrial echo 6.5 mm, right and left ovaries grossly normal and on sonohysterogram endometrial distention showed a 19 x 14 mm left posterior defect suspicious for a polyp versus submucous myoma. Endometrial sample showed inactive endometrium. Patient admitted for hysteroscopy D&C with resection of endometrial polyp/submucous myoma  Past medical history,surgical history, medications, allergies, family history and social history were all reviewed and documented in the EPIC chart.  ROS:  Was performed and pertinent positives and negatives are included in the history of present illness.  Exam:  [name] assistant Vitals:   09/28/16 1543  BP: 124/74   General: well developed, well nourished female, no acute distress HEENT: normal  Lungs: clear to auscultation without wheezing, rales or rhonchi  Cardiac: regular rate without rubs, murmurs or gallops  Abdomen: soft, nontender without masses, guarding, rebound, organomegaly  Pelvic: external bus vagina: normal   Cervix: grossly normal  Uterus: normal size, midline and mobile, nontender  Adnexa: without masses or tenderness     Assessment/Plan:  61 y.o. G2P1011 with new onset postmenopausal bleeding on HRT.  Sonohysterogram suggests an endometrial polyp versus submucous myoma. 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  Admitted for hysteroscopy D&C with resection of the endometrial polyp.  I reviewed the proposed surgery with the patient to include the expected intraoperative and postoperative courses as well as the recovery period. The  use of the hysteroscope, resectoscope and the D&C portion were all discussed. The risks of surgery to include infection, prolonged antibiotics, hemorrhage necessitating transfusion and the risks of transfusion, including transfusion reaction, hepatitis, HIV, mad cow disease and other unknown entities were all discussed understood and accepted. The risk of damage to internal organs during the procedure, either immediately recognized or delay recognized, including vagina, cervix, uterus, possible perforation causing damage to bowel, bladder, ureters, vessels and nerves necessitating major exploratory reparative surgery and future reparative surgeries including bladder repair, ureteral damage repair, bowel resection, ostomy formation was also discussed understood and accepted. The potential for distended media absorption leading to metabolic complications such as fluid overload, coma and seizures was also discussed understood and accepted. She understands there are no guarantees that this will relieve her irregular bleeding and that this may continue afterwards. The patient's questions were answered to her satisfaction and she is ready to proceed with surgery.     [name] MD, 4:54 PM 09/28/2016

## 2016-09-28 NOTE — H&P (Signed)
Rebekah Villanueva [birth year] [id number]   History and Physical  Chief complaint: Postmenopausal bleeding, hormone replacement therapy, endometrial polyp  History of present illness: 61 y.o. G2P1011 on HRT to consist of estradiol 0.5 mg daily and Prometrium 100 mg nightly. Started bleeding on and off over a month ago with some mild cramping. Ultrasound shows uterus mildly enlarged with multiple small myomas, endometrial echo 6.5 mm, right and left ovaries grossly normal and on sonohysterogram endometrial distention showed a 19 x 14 mm left posterior defect suspicious for a polyp versus submucous myoma. Endometrial sample showed inactive endometrium. Patient admitted for hysteroscopy D&C with resection of endometrial polyp/submucous myoma  Past medical history,surgical history, medications, allergies, family history and social history were all reviewed and documented in the EPIC chart.  ROS:  Was performed and pertinent positives and negatives are included in the history of present illness.  Exam:  [name] assistant Vitals:   09/28/16 1543  BP: 124/74   General: well developed, well nourished female, no acute distress HEENT: normal  Lungs: clear to auscultation without wheezing, rales or rhonchi  Cardiac: regular rate without rubs, murmurs or gallops  Abdomen: soft, nontender without masses, guarding, rebound, organomegaly  Pelvic: external bus vagina: normal   Cervix: grossly normal  Uterus: normal size, midline and mobile, nontender  Adnexa: without masses or tenderness     Assessment/Plan:  61 y.o. G2P1011 with new onset postmenopausal bleeding on HRT.  Sonohysterogram suggests an endometrial polyp versus submucous myoma. 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  Admitted for hysteroscopy D&C with resection of the endometrial polyp.  I reviewed the proposed surgery with the patient to include the expected intraoperative and postoperative courses as well as the recovery period. The  use of the hysteroscope, resectoscope and the D&C portion were all discussed. The risks of surgery to include infection, prolonged antibiotics, hemorrhage necessitating transfusion and the risks of transfusion, including transfusion reaction, hepatitis, HIV, mad cow disease and other unknown entities were all discussed understood and accepted. The risk of damage to internal organs during the procedure, either immediately recognized or delay recognized, including vagina, cervix, uterus, possible perforation causing damage to bowel, bladder, ureters, vessels and nerves necessitating major exploratory reparative surgery and future reparative surgeries including bladder repair, ureteral damage repair, bowel resection, ostomy formation was also discussed understood and accepted. The potential for distended media absorption leading to metabolic complications such as fluid overload, coma and seizures was also discussed understood and accepted. She understands there are no guarantees that this will relieve her irregular bleeding and that this may continue afterwards. The patient's questions were answered to her satisfaction and she is ready to proceed with surgery.  [name] MD, 4:59 PM 09/28/2016

## 2016-10-03 ENCOUNTER — Ambulatory Visit (HOSPITAL_BASED_OUTPATIENT_CLINIC_OR_DEPARTMENT_OTHER)
Admission: RE | Admit: 2016-10-03 | Discharge: 2016-10-03 | Disposition: A | Discharge status: Home / Self Care | Payer: BLUE CROSS/BLUE SHIELD | Source: Ambulatory Visit | Attending: Gynecology | Admitting: Gynecology

## 2016-10-03 ENCOUNTER — Ambulatory Visit (HOSPITAL_BASED_OUTPATIENT_CLINIC_OR_DEPARTMENT_OTHER): Payer: BLUE CROSS/BLUE SHIELD | Admitting: Anesthesiology

## 2016-10-03 ENCOUNTER — Encounter (HOSPITAL_BASED_OUTPATIENT_CLINIC_OR_DEPARTMENT_OTHER)
Admission: RE | Disposition: A | Discharge status: Home / Self Care | Payer: Self-pay | Source: Ambulatory Visit | Attending: Gynecology

## 2016-10-03 ENCOUNTER — Encounter (HOSPITAL_BASED_OUTPATIENT_CLINIC_OR_DEPARTMENT_OTHER): Payer: Self-pay

## 2016-10-03 DIAGNOSIS — N950 Postmenopausal bleeding: Secondary | ICD-10-CM | POA: Diagnosis not present

## 2016-10-03 DIAGNOSIS — D250 Submucous leiomyoma of uterus: Secondary | ICD-10-CM | POA: Insufficient documentation

## 2016-10-03 DIAGNOSIS — E785 Hyperlipidemia, unspecified: Secondary | ICD-10-CM | POA: Diagnosis not present

## 2016-10-03 DIAGNOSIS — Z79890 Hormone replacement therapy (postmenopausal): Secondary | ICD-10-CM | POA: Diagnosis not present

## 2016-10-03 DIAGNOSIS — D259 Leiomyoma of uterus, unspecified: Secondary | ICD-10-CM | POA: Diagnosis not present

## 2016-10-03 DIAGNOSIS — N840 Polyp of corpus uteri: Secondary | ICD-10-CM | POA: Diagnosis not present

## 2016-10-03 HISTORY — PX: DILATATION & CURETTAGE/HYSTEROSCOPY WITH MYOSURE: SHX6511

## 2016-10-03 SURGERY — DILATATION & CURETTAGE/HYSTEROSCOPY WITH MYOSURE
Anesthesia: General

## 2016-10-03 MED ORDER — FENTANYL CITRATE (PF) 100 MCG/2ML IJ SOLN
INTRAMUSCULAR | Status: AC
  Filled 2016-10-03: qty 2

## 2016-10-03 MED ORDER — LIDOCAINE 2% (20 MG/ML) 5 ML SYRINGE
INTRAMUSCULAR | Status: AC
  Filled 2016-10-03: qty 5

## 2016-10-03 MED ORDER — DEXTROSE 5 % IV SOLN
2.0000 g | INTRAVENOUS | Status: DC
  Filled 2016-10-03: qty 2

## 2016-10-03 MED ORDER — PROPOFOL 10 MG/ML IV BOLUS
INTRAVENOUS | Status: AC
  Filled 2016-10-03: qty 20

## 2016-10-03 MED ORDER — MIDAZOLAM HCL 2 MG/2ML IJ SOLN
INTRAMUSCULAR | Status: AC
  Filled 2016-10-03: qty 2

## 2016-10-03 MED ORDER — LIDOCAINE 2% (20 MG/ML) 5 ML SYRINGE
INTRAMUSCULAR | Status: DC | PRN
  Administered 2016-10-03: 60 mg via INTRAVENOUS

## 2016-10-03 MED ORDER — LACTATED RINGERS IV SOLN
INTRAVENOUS | Status: DC
  Administered 2016-10-03: 11:00:00 via INTRAVENOUS
  Filled 2016-10-03: qty 1000

## 2016-10-03 MED ORDER — ONDANSETRON HCL 4 MG/2ML IJ SOLN
INTRAMUSCULAR | Status: AC
  Filled 2016-10-03: qty 2

## 2016-10-03 MED ORDER — CEFOTETAN DISODIUM-DEXTROSE 2-2.08 GM-% IV SOLR
INTRAVENOUS | Status: AC
  Filled 2016-10-03: qty 50

## 2016-10-03 MED ORDER — KETOROLAC TROMETHAMINE 30 MG/ML IJ SOLN
INTRAMUSCULAR | Status: AC
  Filled 2016-10-03: qty 1

## 2016-10-03 MED ORDER — MIDAZOLAM HCL 2 MG/2ML IJ SOLN
INTRAMUSCULAR | Status: DC | PRN
  Administered 2016-10-03: 2 mg via INTRAVENOUS

## 2016-10-03 MED ORDER — DEXAMETHASONE SODIUM PHOSPHATE 10 MG/ML IJ SOLN
INTRAMUSCULAR | Status: AC
  Filled 2016-10-03: qty 1

## 2016-10-03 MED ORDER — PROPOFOL 10 MG/ML IV BOLUS
INTRAVENOUS | Status: DC | PRN
  Administered 2016-10-03: 150 mg via INTRAVENOUS

## 2016-10-03 MED ORDER — OXYCODONE-ACETAMINOPHEN 5-325 MG PO TABS: 1.0000 | ORAL_TABLET | ORAL | 0 refills | Status: DC | PRN

## 2016-10-03 MED ORDER — FENTANYL CITRATE (PF) 100 MCG/2ML IJ SOLN
INTRAMUSCULAR | Status: DC | PRN
  Administered 2016-10-03: 50 ug via INTRAVENOUS

## 2016-10-03 MED ORDER — KETOROLAC TROMETHAMINE 30 MG/ML IJ SOLN
INTRAMUSCULAR | Status: DC | PRN
  Administered 2016-10-03: 30 mg via INTRAVENOUS

## 2016-10-03 MED ORDER — LIDOCAINE HCL 1 % IJ SOLN
INTRAMUSCULAR | Status: DC | PRN
  Administered 2016-10-03: 10 mL

## 2016-10-03 MED ORDER — DEXAMETHASONE SODIUM PHOSPHATE 10 MG/ML IJ SOLN
INTRAMUSCULAR | Status: DC | PRN
  Administered 2016-10-03: 10 mg via INTRAVENOUS

## 2016-10-03 MED ORDER — ONDANSETRON HCL 4 MG/2ML IJ SOLN
INTRAMUSCULAR | Status: DC | PRN
  Administered 2016-10-03: 4 mg via INTRAVENOUS

## 2016-10-03 SURGICAL SUPPLY — 24 items
CANISTER SUCT 3000ML PPV (MISCELLANEOUS) ×2 IMPLANT
CATH ROBINSON RED A/P 16FR (CATHETERS) ×2 IMPLANT
CLOTH BEACON ORANGE TIMEOUT ST (SAFETY) ×2 IMPLANT
COUNTER NEEDLE 1200 MAGNETIC (NEEDLE) ×2 IMPLANT
DEVICE MYOSURE LITE (MISCELLANEOUS) IMPLANT
DEVICE MYOSURE REACH (MISCELLANEOUS) ×1 IMPLANT
DILATOR CANAL MILEX (MISCELLANEOUS) IMPLANT
FILTER ARTHROSCOPY CONVERTOR (FILTER) ×2 IMPLANT
GLOVE BIO SURGEON STRL SZ7.5 (GLOVE) ×4 IMPLANT
GOWN STRL REUS W/TWL LRG LVL3 (GOWN DISPOSABLE) ×2 IMPLANT
IV NS IRRIG 3000ML ARTHROMATIC (IV SOLUTION) ×2 IMPLANT
KIT RM TURNOVER CYSTO AR (KITS) ×2 IMPLANT
MYOSURE XL FIBROID REM (MISCELLANEOUS)
NDL SAFETY ECLIPSE 18X1.5 (NEEDLE) IMPLANT
NEEDLE HYPO 18GX1.5 SHARP (NEEDLE)
PACK VAGINAL MINOR WOMEN LF (CUSTOM PROCEDURE TRAY) ×2 IMPLANT
PAD OB MATERNITY 4.3X12.25 (PERSONAL CARE ITEMS) ×2 IMPLANT
SEAL ROD LENS SCOPE MYOSURE (ABLATOR) ×2 IMPLANT
SYRINGE LUER LOK 1CC (MISCELLANEOUS) IMPLANT
SYSTEM TISS REMOVAL MYSR XL RM (MISCELLANEOUS) IMPLANT
TOWEL OR 17X24 6PK STRL BLUE (TOWEL DISPOSABLE) ×4 IMPLANT
TUBING AQUILEX INFLOW (TUBING) ×2 IMPLANT
TUBING AQUILEX OUTFLOW (TUBING) ×2 IMPLANT
WATER STERILE IRR 500ML POUR (IV SOLUTION) IMPLANT

## 2016-10-03 NOTE — H&P (Signed)
The patient was examined.  I reviewed the proposed surgery and consent form with the patient.  The dictated history and physical is current and accurate and all questions were answered. The patient is ready to proceed with surgery and has a realistic understanding and expectation for the outcome.   [name] MD, 12:48 PM 10/03/2016

## 2016-10-03 NOTE — Anesthesia Postprocedure Evaluation (Signed)
Anesthesia Post Note  Patient: ALESIA OSHIELDS  Procedure(s) Performed: Procedure(s) (LRB): DILATATION & CURETTAGE/HYSTEROSCOPY WITH MYOSURE (N/A)     Patient location during evaluation: PACU Anesthesia Type: General Level of consciousness: awake and alert and oriented Pain management: pain level controlled Vital Signs Assessment: post-procedure vital signs reviewed and stable Respiratory status: spontaneous breathing, nonlabored ventilation and respiratory function stable Cardiovascular status: blood pressure returned to baseline and stable Postop Assessment: no signs of nausea or vomiting Anesthetic complications: no    Last Vitals:  Vitals:   10/03/16 1430 10/03/16 1459  BP: 109/65 (!) 109/48  Pulse: (!) 57 60  Resp: 14 16  Temp:  36.7 C  SpO2: 100% 100%    Last Pain:  Vitals:   10/03/16 1429  TempSrc:   PainSc: 1                  Janeisha Ryle A.

## 2016-10-03 NOTE — Anesthesia Procedure Notes (Signed)
Procedure Name: LMA Insertion Date/Time: 10/03/2016 12:55 PM Performed by: Denna Haggard D Pre-anesthesia Checklist: Patient identified, Emergency Drugs available, Suction available and Patient being monitored Patient Re-evaluated:Patient Re-evaluated prior to induction Oxygen Delivery Method: Circle system utilized Preoxygenation: Pre-oxygenation with 100% oxygen Induction Type: IV induction Ventilation: Mask ventilation without difficulty LMA: LMA inserted LMA Size: 4.0 Number of attempts: 1 Airway Equipment and Method: Bite block Placement Confirmation: positive ETCO2 Tube secured with: Tape Dental Injury: Teeth and Oropharynx as per pre-operative assessment

## 2016-10-03 NOTE — Op Note (Signed)
Rebekah Villanueva Sep 10, 1955 [id number]   Post Operative Note   Date of surgery:  10/03/2016  Pre Op Dx:  Postmenopausal bleeding, hormone replacement therapy, endometrial polyp  Post Op Dx:  Is menopausal bleeding, hormone replacement therapy, endometrial polyp, submucous myoma  Procedure:  Hysteroscopy, D&C with Myosure resection endometrial polyp and submucous myoma  Surgeon:  Donalynn Furlong P  Anesthesia:  General  EBL:  Minimal  Distended media discrepancy:  270 cc saline  Complications:  None  Specimen:  #1 endometrial curetting #2 submucous myoma fragments and endometrial polyp to pathology  Findings: EUA:  External BUS vagina with atrophic changes. Cervix normal. Uterus grossly normal midline mobile. Adnexa without masses   Hysteroscopy:  Adequate noting fundus, anterior/posterior endometrial surfaces, right/left tubal ostia, lower uterine segment and endocervical canal all visualized. Small fingerlike endometrial polyp left lateral upper endometrial surface resected to the level the surrounding endometrium. Submucous myoma with approximately 50% protrusion into the cavity mid lower posterior cavity. Total resection approximately 90%.  Procedure:  The patient was taken to the operating room, was placed in the low dorsal lithotomy position, underwent general anesthesia, received a perineal/vaginal preparation with Betadine solution per nursing personnel, the patient voided immediately before coming to the OR and an in and out Foley catheterization was not done.. The timeout was performed by the surgical team. An EUA was performed. The patient was draped in the usual fashion. The cervix was visualized with a speculum, anterior lip grasped with a single-tooth tenaculum and a paracervical block was placed using 10 cc's of 1% lidocaine. The cervix was gently dilated to admit the Myosure hysteroscope and hysteroscopy was performed with findings noted above. Using the Myosure Reach  resectoscopic wand the polyp was resected in it's entirety to the level the surrounding endometrium. The myoma was then resected to the level of the surrounding endometrium, the pressure relieved within the cavity to allow more myoma to protrude and this was repeated until no further myoma protruded from the surrounding endometrial surface with an estimated of 90% resection. A gentle sharp curettage was performed. Both specimens were sent separately to pathology.  Repeat hysteroscopy showed an empty cavity with good distention and no evidence of perforation. The instruments were removed and adequate hemostasis was visualized at the tenaculum site and external cervical os. The patient was given intraoperative Toradol, was awakened without difficulty and was taken to the recovery room in good condition having tolerated the procedure well.     [name] MD, 1:35 PM 10/03/2016

## 2016-10-03 NOTE — Discharge Instructions (Signed)
° °  Postoperative Instructions Hysteroscopy D & C  Dr. Phineas Real and the nursing staff have discussed postoperative instructions with you.  If you have any questions please ask them before you leave the hospital, or call Dr Elisabeth Most office at [phone].    We would like to emphasize the following instructions:   ? Call the office to make your follow-up appointment as recommended by Dr Phineas Real (usually 1-2 weeks).  ? You were given a prescription, or one was ordered for you at the pharmacy you designated.  Get that prescription filled and take the medication according to instructions.  ? You may eat a regular diet, but slowly until you start having bowel movements.  ? Drink plenty of water daily.  ? Nothing in the vagina (intercourse, douching, objects of any kind) for two weeks.  When reinitiating intercourse, if it is uncomfortable, stop and make an appointment with Dr Phineas Real to be evaluated.  ? No driving for one to two days until the effects of anesthesia has worn off.  No traveling out of town for several days.  ? You may shower, but no baths for one week.  Walking up and down stairs is ok.  No heavy lifting, prolonged standing, repeated bending or any working out until your post op check.  ? Rest frequently, listen to your body and do not push yourself and overdo it.  ? Call if:  o Your pain medication does not seem strong enough. o Worsening pain or abdominal bloating o Persistent nausea or vomiting o Difficulty with urination or bowel movements. o Temperature of 101 degrees or higher. o Heavy vaginal bleeding.  If your period is due, you may use tampons. o You have any questions or concerns   Do not take any nonsteroidal anti inflammatories ( advil, motrin, aleve, ibuprofen) until after 6:30 pm tonight.    Post Anesthesia Home Care Instructions  Activity: Get plenty of rest for the remainder of the day. A responsible individual must stay with you for 24 hours  following the procedure.  For the next 24 hours, DO NOT: -Drive a car -Paediatric nurse -Drink alcoholic beverages -Take any medication unless instructed by your physician -Make any legal decisions or sign important papers.  Meals: Start with liquid foods such as gelatin or soup. Progress to regular foods as tolerated. Avoid greasy, spicy, heavy foods. If nausea and/or vomiting occur, drink only clear liquids until the nausea and/or vomiting subsides. Call your physician if vomiting continues.  Special Instructions/Symptoms: Your throat may feel dry or sore from the anesthesia or the breathing tube placed in your throat during surgery. If this causes discomfort, gargle with warm salt water. The discomfort should disappear within 24 hours.  If you had a scopolamine patch placed behind your ear for the management of post- operative nausea and/or vomiting:  1. The medication in the patch is effective for 72 hours, after which it should be removed.  Wrap patch in a tissue and discard in the trash. Wash hands thoroughly with soap and water. 2. You may remove the patch earlier than 72 hours if you experience unpleasant side effects which may include dry mouth, dizziness or visual disturbances. 3. Avoid touching the patch. Wash your hands with soap and water after contact with the patch.

## 2016-10-03 NOTE — Anesthesia Preprocedure Evaluation (Signed)
Anesthesia Evaluation  Patient identified by MRN, date of birth, ID band Patient awake    Reviewed: Allergy & Precautions, NPO status , Patient's Chart, lab work & pertinent test results  Airway Mallampati: II  TM Distance: >3 FB Neck ROM: Full    Dental no notable dental hx. (+) Teeth Intact   Pulmonary neg pulmonary ROS,    Pulmonary exam normal breath sounds clear to auscultation       Cardiovascular negative cardio ROS Normal cardiovascular exam Rhythm:Regular Rate:Normal     Neuro/Psych negative neurological ROS  negative psych ROS   GI/Hepatic negative GI ROS, Neg liver ROS,   Endo/Other  Hyperlipidemia  Renal/GU negative Renal ROS  negative genitourinary   Musculoskeletal   Abdominal   Peds  Hematology negative hematology ROS (+)   Anesthesia Other Findings   Reproductive/Obstetrics PMB Endometrial polyp Leiomyoma                             Anesthesia Physical Anesthesia Plan  ASA: II  Anesthesia Plan: General   Post-op Pain Management:    Induction: Intravenous  PONV Risk Score and Plan: 4 or greater and Ondansetron, Dexamethasone, Midazolam, Propofol infusion and Metaclopromide  Airway Management Planned: LMA  Additional Equipment:   Intra-op Plan:   Post-operative Plan: Extubation in OR  Informed Consent: I have reviewed the patients History and Physical, chart, labs and discussed the procedure including the risks, benefits and alternatives for the proposed anesthesia with the patient or authorized representative who has indicated his/her understanding and acceptance.   Dental advisory given  Plan Discussed with: CRNA, Anesthesiologist and Surgeon  Anesthesia Plan Comments:         Anesthesia Quick Evaluation

## 2016-10-04 ENCOUNTER — Encounter (HOSPITAL_BASED_OUTPATIENT_CLINIC_OR_DEPARTMENT_OTHER): Payer: Self-pay | Admitting: Gynecology

## 2016-10-04 ENCOUNTER — Telehealth: Payer: Self-pay | Admitting: Gynecology

## 2016-10-04 LAB — POCT HEMOGLOBIN-HEMACUE: Hemoglobin: 13.1 g/dL (ref 12.0–15.0)

## 2016-10-04 NOTE — Telephone Encounter (Signed)
Tell patient pathology from the [facility] showed benign tissue to include both polypoid endometrium and fragments of leiomyoma

## 2016-10-05 ENCOUNTER — Ambulatory Visit: Payer: BLUE CROSS/BLUE SHIELD | Admitting: Cardiology

## 2016-10-08 ENCOUNTER — Telehealth: Payer: Self-pay | Admitting: *Deleted

## 2016-10-08 ENCOUNTER — Encounter (HOSPITAL_BASED_OUTPATIENT_CLINIC_OR_DEPARTMENT_OTHER): Payer: Self-pay | Admitting: Gynecology

## 2016-10-08 NOTE — Telephone Encounter (Signed)
Not unusual to have some bleeding that can occur within the first month or so after D&C well things are still healing up. As long as it's not persistent or heavy with pain.

## 2016-10-08 NOTE — Telephone Encounter (Signed)
Pt informed

## 2016-10-08 NOTE — Telephone Encounter (Signed)
Pt had D&C on 10/03/16 asked me to relay she had bleeding on Friday and the weekend wore 1 pad the whole day for each day, mentioned she felt a little weak the weekend and today. Please advise

## 2016-10-08 NOTE — Transfer of Care (Signed)
Immediate Anesthesia Transfer of Care Note  Patient: Rebekah Villanueva  Procedure(s) Performed: Procedure(s): DILATATION & CURETTAGE/HYSTEROSCOPY WITH MYOSURE (N/A)  Patient Location: PACU  Anesthesia Type:General  Level of Consciousness: awake, alert , oriented and patient cooperative  Airway & Oxygen Therapy: Patient Spontanous Breathing and Patient connected to nasal cannula oxygen  Post-op Assessment: Report given to RN and Post -op Vital signs reviewed and stable  Post vital signs: Reviewed and stable  Last Vitals:  Vitals:   10/03/16 1430 10/03/16 1459  BP: 109/65 (!) 109/48  Pulse: (!) 57 60  Resp: 14 16  Temp:  36.7 C  SpO2: 100% 100%    Last Pain:  Vitals:   10/03/16 1429  TempSrc:   PainSc: 1       Patients Stated Pain Goal: 7 (86/76/19 [id number])  Complications: No apparent anesthesia complications

## 2016-10-12 ENCOUNTER — Encounter: Payer: Self-pay | Admitting: Cardiology

## 2016-10-12 ENCOUNTER — Ambulatory Visit (INDEPENDENT_AMBULATORY_CARE_PROVIDER_SITE_OTHER): Payer: BLUE CROSS/BLUE SHIELD | Admitting: Cardiology

## 2016-10-12 VITALS — BP 100/62 | HR 72 | Resp 10 | Ht 62.0 in | Wt 128.8 lb

## 2016-10-12 DIAGNOSIS — E785 Hyperlipidemia, unspecified: Secondary | ICD-10-CM | POA: Diagnosis not present

## 2016-10-12 DIAGNOSIS — R7303 Prediabetes: Secondary | ICD-10-CM

## 2016-10-12 DIAGNOSIS — Z8679 Personal history of other diseases of the circulatory system: Secondary | ICD-10-CM | POA: Diagnosis not present

## 2016-10-12 DIAGNOSIS — I739 Peripheral vascular disease, unspecified: Secondary | ICD-10-CM

## 2016-10-12 MED ORDER — ATORVASTATIN CALCIUM 20 MG PO TABS: 20.0000 mg | ORAL_TABLET | Freq: Every evening | ORAL | 3 refills | Status: DC

## 2016-10-12 NOTE — Patient Instructions (Signed)
Medication Instructions:  Your physician recommends that you continue on your current medications as directed. Please refer to the Current Medication list given to you today. Labwork: Your physician recommends that you have labs drawn today to check a complete metabolic panel, complete blood cell count, Thyroid level, A1C  Testing/Procedures: Your physician has requested that you have a carotid duplex. This test is an ultrasound of the carotid arteries in your neck. It looks at blood flow through these arteries that supply the brain with blood. Allow one hour for this exam. There are no restrictions or special instructions.   Follow-Up: Your physician recommends that you schedule a follow-up appointment in: 1 month   Any Other Special Instructions Will Be Listed Below (If Applicable).  Please note that any paperwork needing to be filled out by the provider will need to be addressed at the front desk prior to seeing the provider. Please note that any paperwork FMLA, Disability or other documents regarding health condition is subject to a $25.00 charge that must be received prior to completion of paperwork in the form of a money order or check.    If you need a refill on your cardiac medications before your next appointment, please call your pharmacy.

## 2016-10-12 NOTE — Progress Notes (Signed)
Cardiology Office Note:    Date:  10/12/2016   ID:  Rebekah Villanueva, DOB 1955/09/26, MRN [id number]  PCP:  Raina Mina., MD  Cardiologist:  Jenne Campus, MD    Referring MD: Raina Mina., MD   Chief Complaint  Patient presents with  . Follow-up  i'm doing well but I need to be checked.  History of Present Illness:    Rebekah Villanueva is a 61 y.o. female  With past medical history significant for peripheral vascular disease, dyslipidemia. She also has borderline diabetes.I've seen her last time she years ago  And she is coming today to be checked. She denies having the chest pain tightness squeezing pressure burning chest. But she does not exercise on the radial basis because of chronic problem with her leg. About 5 weeks ago she had surgery in her life since that time sheadually getting better and she spiked to return to her exercises. Denies having any tightness pressure burning in the chest, no TIA/CVA symptoms. She also admits that she does not follow on the radial basis with her primary care physician.  Past Medical History:  Diagnosis Date  . Endometrial polyp   . Mixed hyperlipidemia   . Multinodular thyroid    goiter--- per pt hx benign bx's  . Osteopenia 01/2014   T score -1.9 FRAX 6.9%/0.6%  . PMB (postmenopausal bleeding)   . Uterine fibroid   . Venous insufficiency of left leg     Past Surgical History:  Procedure Laterality Date  . CARPAL TUNNEL RELEASE Right 08/24/2014   Procedure:  RIGHT CARPAL TUNNEL RELEASE;  Surgeon: Daryll Brod, MD;  Location: [place];  Service: Orthopedics;  Laterality: Right;  REGIONAL/FAB  . CARPAL TUNNEL RELEASE Left 06/2015  . CESAREAN SECTION  1996  . CYST EXCISION Right 08/23/2016   Procedure: Right hallux excision of mucous cyst and local soft tissue flap rearrangement;  Surgeon: Wylene Simmer, MD;  Location: [place];  Service: Orthopedics;  Laterality: Right;  . DILATATION & CURETTAGE/HYSTEROSCOPY  WITH MYOSURE N/A 10/03/2016   Procedure: [place];  Surgeon: [name], MD;  Location: [place];  Service: Gynecology;  Laterality: N/A;  . MYOMECTOMY  1994  . 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 TONSILLECTOMY AND ADENOIDECTOMY  age 42    Current Medications: Current Meds  Medication Sig  . atorvastatin (LIPITOR) 20 MG tablet Take 20 mg by mouth every evening.   . Calcium Carbonate (CALCIUM 600 PO) Take 1 tablet by mouth daily.  Marland Kitchen estradiol (ESTRACE) 1 MG tablet Take 1 tablet (1 mg total) by mouth daily. (Patient taking differently: Take 1 mg by mouth every morning. )  . Multiple Vitamin (MULTIVITAMIN) tablet Take 1 tablet by mouth daily.    . Multiple Vitamins-Minerals (HAIR SKIN AND NAILS FORMULA) TABS Take 1 tablet by mouth.  . naproxen sodium (ANAPROX) 220 MG tablet Take 220 mg by mouth as needed.  . Omega-3 Fatty Acids (OMEGA 3 PO) Take 1 capsule by mouth daily.   . progesterone (PROMETRIUM) 100 MG capsule Take 1 capsule (100 mg total) by mouth at bedtime.     Allergies:   Codeine   Social History   Social History  . Marital status: Single    Spouse name: N/A  . Number of children: N/A  . Years of education: N/A   Social History Main Topics  . Smoking status: Never Smoker  .  Smokeless tobacco: Never Used  . Alcohol use 0.6 oz/week    1 Standard drinks or equivalent per week     Comment: social  . Drug use: No  . Sexual activity: Yes    Birth control/ protection: Post-menopausal     Comment: 1st intercourse 61 yo-More than 5 partners   Other Topics Concern  . None   Social History Narrative  . None     Family History: The patient's family history includes Diabetes in her mother; Hyperlipidemia in her mother and sister; Hypertension in her mother. ROS:   Please see the history of present illness.    All 14 point review of systems negative except as described per history of  present illness  EKGs/Labs/Other Studies Reviewed:      Recent Labs: 09/20/2016: TSH 1.07 09/28/2016: ALT 25; BUN 16; Creat 0.74; Platelets 289; Potassium 4.6; Sodium 140 10/03/2016: Hemoglobin 13.1  Recent Lipid Panel No results found for: CHOL, TRIG, HDL, CHOLHDL, VLDL, LDLCALC, LDLDIRECT  Physical Exam:    VS:  BP 100/62   Pulse 72   Resp 10   Ht 5\' 2"  (1.575 m)   Wt 128 lb 12.8 oz (58.4 kg)   BMI 23.56 kg/m     Wt Readings from Last 3 Encounters:  10/12/16 128 lb 12.8 oz (58.4 kg)  10/03/16 130 lb (59 kg)  08/23/16 128 lb 6 oz (58.2 kg)     GEN:  Well nourished, well developed in no acute distress HEENT: Normal NECK: No JVD; No carotid bruits LYMPHATICS: No lymphadenopathy CARDIAC: RRR, no murmurs, no rubs, no gallops RESPIRATORY:  Clear to auscultation without rales, wheezing or rhonchi  ABDOMEN: Soft, non-tender, non-distended MUSCULOSKELETAL:  No edema; No deformity  SKIN: Warm and dry LOWER EXTREMITIES: no swelling NEUROLOGIC:  Alert and oriented x 3 PSYCHIATRIC:  Normal affect   ASSESSMENT:    1. Peripheral vascular disease ([place])   2. Dyslipidemia   3. Borderline diabetes    PLAN:    In order of problems listed above:  1. Peripheral vascular disease: We'll schedule her to have carotid ultrasound. 2. Dyslipidemia: We'll check a fasting lipid profile today. 3. [place] diabetes: We'll check her fasting lipid profile as well as hemoglobin A1c and complete metabolic panel. 4. Apparently she had some cysts in her thyroidreviously, I will ask her to have caroti and thyroid profile   Medication Adjustments/Labs and Tests Ordered: Current medicines are reviewed at length with the patient today.  Concerns regarding medicines are outlined above.  No orders of the defined types were placed in this encounter.  Medication changes: No orders of the defined types were placed in this encounter.   Signed, Park Liter, MD, [facility] 10/12/2016 10:13 AM    [place]

## 2016-10-13 LAB — COMPREHENSIVE METABOLIC PANEL
A/G RATIO: 1.9 (ref 1.2–2.2)
ALT: 23 IU/L (ref 0–32)
AST: 19 IU/L (ref 0–40)
Albumin: 4.1 g/dL (ref 3.6–4.8)
Alkaline Phosphatase: 64 IU/L (ref 39–117)
BILIRUBIN TOTAL: 0.4 mg/dL (ref 0.0–1.2)
BUN/Creatinine Ratio: 27 (ref 12–28)
BUN: 20 mg/dL (ref 8–27)
CALCIUM: 9.5 mg/dL (ref 8.7–10.3)
CHLORIDE: 106 mmol/L (ref 96–106)
CO2: 22 mmol/L (ref 20–29)
Creatinine, Ser: 0.75 mg/dL (ref 0.57–1.00)
GFR calc Af Amer: 99 mL/min/{1.73_m2} (ref >59)
GFR, EST NON AFRICAN AMERICAN: 86 mL/min/{1.73_m2} (ref >59)
GLUCOSE: 94 mg/dL (ref 65–99)
Globulin, Total: 2.2 g/dL (ref 1.5–4.5)
POTASSIUM: 4.6 mmol/L (ref 3.5–5.2)
Sodium: 144 mmol/L (ref 134–144)
TOTAL PROTEIN: 6.3 g/dL (ref 6.0–8.5)

## 2016-10-13 LAB — CBC WITH DIFFERENTIAL/PLATELET
BASOS ABS: 0.1 10*3/uL (ref 0.0–0.2)
Basos: 1 %
EOS (ABSOLUTE): 0.1 10*3/uL (ref 0.0–0.4)
Eos: 2 %
Hematocrit: 37.1 % (ref 34.0–46.6)
Hemoglobin: 12.4 g/dL (ref 11.1–15.9)
IMMATURE GRANS (ABS): 0 10*3/uL (ref 0.0–0.1)
IMMATURE GRANULOCYTES: 0 %
LYMPHS: 19 %
Lymphocytes Absolute: 1 10*3/uL (ref 0.7–3.1)
MCH: 31.3 pg (ref 26.6–33.0)
MCHC: 33.4 g/dL (ref 31.5–35.7)
MCV: 94 fL (ref 79–97)
MONOS ABS: 0.6 10*3/uL (ref 0.1–0.9)
Monocytes: 10 %
NEUTROS PCT: 68 %
Neutrophils Absolute: 3.7 10*3/uL (ref 1.4–7.0)
PLATELETS: 290 10*3/uL (ref 150–379)
RBC: 3.96 x10E6/uL (ref 3.77–5.28)
RDW: 13.8 % (ref 12.3–15.4)
WBC: 5.4 10*3/uL (ref 3.4–10.8)

## 2016-10-13 LAB — LIPID PANEL
CHOLESTEROL TOTAL: 234 mg/dL — AB (ref 100–199)
Chol/HDL Ratio: 3.6 ratio (ref 0.0–4.4)
HDL: 65 mg/dL (ref >39)
LDL Calculated: 143 mg/dL — ABNORMAL HIGH (ref 0–99)
TRIGLYCERIDES: 128 mg/dL (ref 0–149)
VLDL Cholesterol Cal: 26 mg/dL (ref 5–40)

## 2016-10-13 LAB — HEMOGLOBIN A1C
ESTIMATED AVERAGE GLUCOSE: 97 mg/dL
HEMOGLOBIN A1C: 5 % (ref 4.8–5.6)

## 2016-10-13 LAB — TSH: TSH: 0.725 u[IU]/mL (ref 0.450–4.500)

## 2016-10-15 ENCOUNTER — Telehealth: Payer: Self-pay

## 2016-10-15 DIAGNOSIS — E785 Hyperlipidemia, unspecified: Secondary | ICD-10-CM

## 2016-10-15 MED ORDER — ATORVASTATIN CALCIUM 40 MG PO TABS: 20.0000 mg | ORAL_TABLET | Freq: Every evening | ORAL | 3 refills | Status: DC

## 2016-10-15 NOTE — Telephone Encounter (Signed)
Pt states that she has only been taking 10 mg of lipitor daily. I have asked her to increase to 20 mg daily. She will recheck her labs prior to her f/u.

## 2016-10-19 ENCOUNTER — Ambulatory Visit (HOSPITAL_BASED_OUTPATIENT_CLINIC_OR_DEPARTMENT_OTHER)
Admission: RE | Admit: 2016-10-19 | Discharge: 2016-10-19 | Disposition: A | Discharge status: Home / Self Care | Payer: BLUE CROSS/BLUE SHIELD | Source: Ambulatory Visit | Attending: Cardiology | Admitting: Cardiology

## 2016-10-19 DIAGNOSIS — Z8679 Personal history of other diseases of the circulatory system: Secondary | ICD-10-CM | POA: Insufficient documentation

## 2016-10-19 NOTE — Progress Notes (Signed)
Carotid Study Performed

## 2016-10-19 NOTE — Progress Notes (Signed)
  Echocardiogram 2D Echocardiogram has been performed.  Jereline Ticer T Ivone Licht 10/19/2016, 9:56 AM

## 2016-10-23 LAB — VAS US CAROTID
LCCADDIAS: 31 cm/s
LEFT ECA DIAS: -9 cm/s
LEFT VERTEBRAL DIAS: -9 cm/s
LICAPSYS: -52 cm/s
Left CCA dist sys: 94 cm/s
Left CCA prox dias: 22 cm/s
Left CCA prox sys: 62 cm/s
Left ICA dist sys: -94 cm/s
RCCAPSYS: 64 cm/s
RIGHT ECA DIAS: -10 cm/s
RIGHT VERTEBRAL DIAS: -12 cm/s
Right CCA prox dias: 22 cm/s
Right cca dist sys: -74 cm/s

## 2016-10-26 ENCOUNTER — Ambulatory Visit (INDEPENDENT_AMBULATORY_CARE_PROVIDER_SITE_OTHER): Payer: BLUE CROSS/BLUE SHIELD | Admitting: Gynecology

## 2016-10-26 ENCOUNTER — Encounter: Payer: Self-pay | Admitting: Gynecology

## 2016-10-26 VITALS — BP 118/70

## 2016-10-26 DIAGNOSIS — Z09 Encounter for follow-up examination after completed treatment for conditions other than malignant neoplasm: Secondary | ICD-10-CM

## 2016-10-26 NOTE — Progress Notes (Signed)
    Rebekah Villanueva 07/18/1955 [id number]        61 y.o.  G2P1011 presents for her postoperative visit status post hysteroscopic resection of endometrial polyp and submucous myoma. Has done well with no bleeding or pain.  Past medical history,surgical history, problem list, medications, allergies, family history and social history were all reviewed and documented in the EPIC chart.  Directed ROS with pertinent positives and negatives documented in the history of present illness/assessment and plan.  Exam: [name] assistant Vitals:   10/26/16 0925  BP: 118/70   General appearance:  Normal Abdomen soft nontender without masses guarding rebound Pelvic external BUS vagina with atrophic changes. Cervix normal. Uterus minimally enlarged midline mobile nontender. Adnexa without masses or tenderness.  Assessment/Plan:  61 y.o. G2P1011 with normal postoperative visit status post hysteroscopy D&C with resection of endometrial polyp and submucous myoma. Reviewed benign pathology and pictures of the surgery with her. Patient will follow up in January when due for annual exam. Sooner if any issues.    [name] MD, 9:46 AM 10/26/2016

## 2016-10-26 NOTE — Patient Instructions (Signed)
Follow up for your annual exam in January 2019

## 2016-11-16 ENCOUNTER — Ambulatory Visit: Payer: BLUE CROSS/BLUE SHIELD | Admitting: Cardiology

## 2016-11-30 ENCOUNTER — Ambulatory Visit: Payer: BLUE CROSS/BLUE SHIELD | Admitting: Cardiology

## 2016-12-21 ENCOUNTER — Ambulatory Visit (INDEPENDENT_AMBULATORY_CARE_PROVIDER_SITE_OTHER): Payer: BLUE CROSS/BLUE SHIELD | Admitting: Cardiology

## 2016-12-21 ENCOUNTER — Encounter: Payer: Self-pay | Admitting: Cardiology

## 2016-12-21 VITALS — BP 110/66 | HR 56 | Ht 62.0 in | Wt 132.0 lb

## 2016-12-21 DIAGNOSIS — R7303 Prediabetes: Secondary | ICD-10-CM

## 2016-12-21 DIAGNOSIS — E785 Hyperlipidemia, unspecified: Secondary | ICD-10-CM

## 2016-12-21 DIAGNOSIS — I739 Peripheral vascular disease, unspecified: Secondary | ICD-10-CM

## 2016-12-21 NOTE — Addendum Note (Signed)
Addended by: Aleatha Borer on: 12/21/2016 11:56 AM   Modules accepted: Orders

## 2016-12-21 NOTE — Progress Notes (Signed)
Cardiology Office Note:    Date:  12/21/2016   ID:  DANIELYS MADRY, DOB 19-Jul-1955, MRN [id number]  PCP:  Raina Mina., MD  Cardiologist:  Jenne Campus, MD    Referring MD: Raina Mina., MD   Chief Complaint  Patient presents with  . Follow up Carotid  Doing well  History of Present Illness:    Rebekah Villanueva is a 61 y.o. female with peripheral vascular disease, borderline diabetes, dyslipidemia.  She comes to our office to discuss results of her test likely carotid ultrasounds did not show any significant stenosis.  Her hemoglobin A1c was 5.0 which is absolutely within normal limits.  Her cholesterol however was elevated and we doubled the dose of Lipitor.  I will ask her to have fasting lipid profile done today and she is fasting we talked at length about risk factors modifications including exercises on the regular basis.  She is committed to join the gym and I strongly advised her to do that.  Past Medical History:  Diagnosis Date  . Endometrial polyp   . Mixed hyperlipidemia   . Multinodular thyroid    goiter--- per pt hx benign bx's  . Osteopenia 01/2014   T score -1.9 FRAX 6.9%/0.6%  . PMB (postmenopausal bleeding)   . Uterine fibroid   . Venous insufficiency of left leg     Past Surgical History:  Procedure Laterality Date  . CARPAL TUNNEL RELEASE Right 08/24/2014   Procedure:  RIGHT CARPAL TUNNEL RELEASE;  Surgeon: Daryll Brod, MD;  Location: [place];  Service: Orthopedics;  Laterality: Right;  REGIONAL/FAB  . CARPAL TUNNEL RELEASE Left 06/2015  . CESAREAN SECTION  1996  . CYST EXCISION Right 08/23/2016   Procedure: Right hallux excision of mucous cyst and local soft tissue flap rearrangement;  Surgeon: Wylene Simmer, MD;  Location: [place];  Service: Orthopedics;  Laterality: Right;  . DILATATION & CURETTAGE/HYSTEROSCOPY WITH MYOSURE N/A 10/03/2016   Procedure: [place];  Surgeon: [name], MD;  Location: [place];  Service: Gynecology;  Laterality: N/A;  . MYOMECTOMY  1994  . 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 TONSILLECTOMY AND ADENOIDECTOMY  age 65    Current Medications: Current Meds  Medication Sig  . atorvastatin (LIPITOR) 20 MG tablet Take 20 mg by mouth daily.  . Calcium Carbonate (CALCIUM 600 PO) Take 1 tablet by mouth daily.  Marland Kitchen estradiol (ESTRACE) 1 MG tablet Take 1 tablet (1 mg total) by mouth daily. (Patient taking differently: Take 1 mg by mouth every morning. )  . Multiple Vitamin (MULTIVITAMIN) tablet Take 1 tablet by mouth daily.    . Multiple Vitamins-Minerals (HAIR SKIN AND NAILS FORMULA) TABS Take 1 tablet by mouth.  . Omega-3 Fatty Acids (OMEGA 3 PO) Take 1 capsule by mouth daily.   . progesterone (PROMETRIUM) 100 MG capsule Take 1 capsule (100 mg total) by mouth at bedtime.     Allergies:   Codeine   Social History   Socioeconomic History  . Marital status: Single    Spouse name: None  . Number of children: None  . Years of education: None  . Highest education level: None  Social Needs  . Financial resource strain: None  . Food insecurity - worry: None  . Food insecurity - inability: None  . Transportation needs - medical: None  . Transportation needs - non-medical: None  Occupational History  . None  Tobacco Use  . Smoking status: Never Smoker  . Smokeless tobacco: Never Used  Substance and Sexual Activity  . Alcohol use: Yes    Alcohol/week: 0.6 oz    Types: 1 Standard drinks or equivalent per week    Comment: social  . Drug use: No  . Sexual activity: Yes    Birth control/protection: Post-menopausal    Comment: 1st intercourse 61 yo-More than 5 partners  Other Topics Concern  . None  Social History Narrative  . None     Family History: The patient's family history includes Diabetes in her mother; Hyperlipidemia in her mother and sister; Hypertension in her mother. ROS:     Please see the history of present illness.    All 14 point review of systems negative except as described per history of present illness  EKGs/Labs/Other Studies Reviewed:      Recent Labs: 10/12/2016: ALT 23; BUN 20; Creatinine, Ser 0.75; Hemoglobin 12.4; Platelets 290; Potassium 4.6; Sodium 144; TSH 0.725  Recent Lipid Panel    Component Value Date/Time   CHOL 234 (H) 10/12/2016 1031   TRIG 128 10/12/2016 1031   HDL 65 10/12/2016 1031   CHOLHDL 3.6 10/12/2016 1031   LDLCALC 143 (H) 10/12/2016 1031    Physical Exam:    VS:  BP 110/66   Pulse (!) 56   Ht 5\' 2"  (1.575 m)   Wt 132 lb (59.9 kg)   HC 10" (25.4 cm)   BMI 24.14 kg/m     Wt Readings from Last 3 Encounters:  12/21/16 132 lb (59.9 kg)  10/12/16 128 lb 12.8 oz (58.4 kg)  10/03/16 130 lb (59 kg)     GEN:  Well nourished, well developed in no acute distress HEENT: Normal NECK: No JVD; No carotid bruits LYMPHATICS: No lymphadenopathy CARDIAC: RRR, no murmurs, no rubs, no gallops RESPIRATORY:  Clear to auscultation without rales, wheezing or rhonchi  ABDOMEN: Soft, non-tender, non-distended MUSCULOSKELETAL:  No edema; No deformity  SKIN: Warm and dry LOWER EXTREMITIES: no swelling NEUROLOGIC:  Alert and oriented x 3 PSYCHIATRIC:  Normal affect   ASSESSMENT:    1. Peripheral vascular disease ([employer])   2. Borderline diabetes   3. Dyslipidemia    PLAN:    In order of problems listed above:  1. Peripheral vascular disease: Last carotid ultrasounds did not show any significant stenosis we will repeat it in a year. 2.  3. Diabetes: Hemoglobin A1c is perfectly within normal limits we will continue present management again she can benefit from exercise on a regular basis. 4. Dyslipidemia: Lipitor has been increased recently will recheck fasting lipid profile today   Medication Adjustments/Labs and Tests Ordered: Current medicines are reviewed at length with the patient today.  Concerns regarding medicines  are outlined above.  No orders of the defined types were placed in this encounter.  Medication changes: No orders of the defined types were placed in this encounter.   Signed, Park Liter, MD, [facility] 12/21/2016 10:37 AM    [place]

## 2016-12-22 DIAGNOSIS — M67479 Ganglion, unspecified ankle and foot: Secondary | ICD-10-CM

## 2016-12-22 HISTORY — DX: Ganglion, unspecified ankle and foot: M67.479

## 2016-12-22 LAB — LIPID PANEL
CHOLESTEROL TOTAL: 181 mg/dL (ref 100–199)
Chol/HDL Ratio: 3 ratio (ref 0.0–4.4)
HDL: 61 mg/dL (ref >39)
LDL CALC: 98 mg/dL (ref 0–99)
Triglycerides: 112 mg/dL (ref 0–149)
VLDL Cholesterol Cal: 22 mg/dL (ref 5–40)

## 2016-12-27 ENCOUNTER — Telehealth: Payer: Self-pay

## 2016-12-27 DIAGNOSIS — E785 Hyperlipidemia, unspecified: Secondary | ICD-10-CM

## 2016-12-27 MED ORDER — ATORVASTATIN CALCIUM 40 MG PO TABS: 40.0000 mg | ORAL_TABLET | Freq: Every day | ORAL | 3 refills | Status: DC

## 2016-12-27 NOTE — Telephone Encounter (Signed)
Patient advised to double Lipitor to 40 mg daily. Patient verbalized understanding. Advised patient to have lipid panel rechecked in 6 weeks around February 07, 2017. Advised patient she could go to [facility] in [place] or this office. Patient verbalized understanding. No further questions.

## 2016-12-27 NOTE — Telephone Encounter (Signed)
-----   Message from Park Liter, MD sent at 12/24/2016  8:51 AM EST ----- Lipids significatly improved but still needs to be better, double the dose of lipitor, flp in 6 weeks

## 2016-12-28 DIAGNOSIS — M67471 Ganglion, right ankle and foot: Secondary | ICD-10-CM | POA: Diagnosis not present

## 2017-01-07 ENCOUNTER — Encounter (HOSPITAL_BASED_OUTPATIENT_CLINIC_OR_DEPARTMENT_OTHER): Payer: Self-pay | Admitting: *Deleted

## 2017-01-07 ENCOUNTER — Other Ambulatory Visit: Payer: Self-pay | Admitting: Orthopedic Surgery

## 2017-01-07 ENCOUNTER — Other Ambulatory Visit: Payer: Self-pay

## 2017-01-08 IMAGING — US US EXTREM LOW VENOUS*L*
1 series · 13 of 24 positions shown · non-contrast
Comparison: None.

CLINICAL DATA: 60-year-old female with intermittent left lower
extremity aching

EXAM:
LEFT LOWER EXTREMITY VENOUS DUPLEX ULTRASOUND
TECHNIQUE: Gray-scale sonography with graded compression, as well as color
Doppler and duplex ultrasound, were performed to evaluate the deep
and superficial veins of both lower extremities. Spectral Doppler
was utilized to evaluate flow at rest and with distal augmentation
maneuvers. A complete superficial venous insufficiency exam was
performed in the upright standing position. I personally performed
the technical portion of the exam.

[Series 1: us extrem low venous*left* · 13 of 27 slices shown]
[im 1/27]
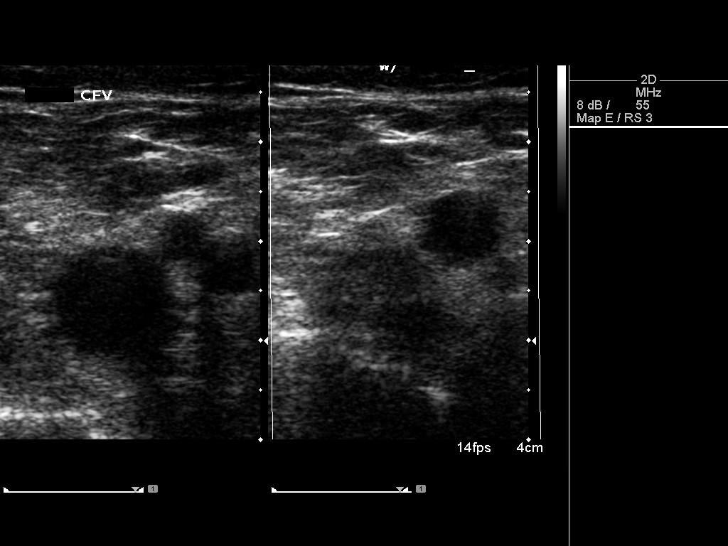
[im 3/27]
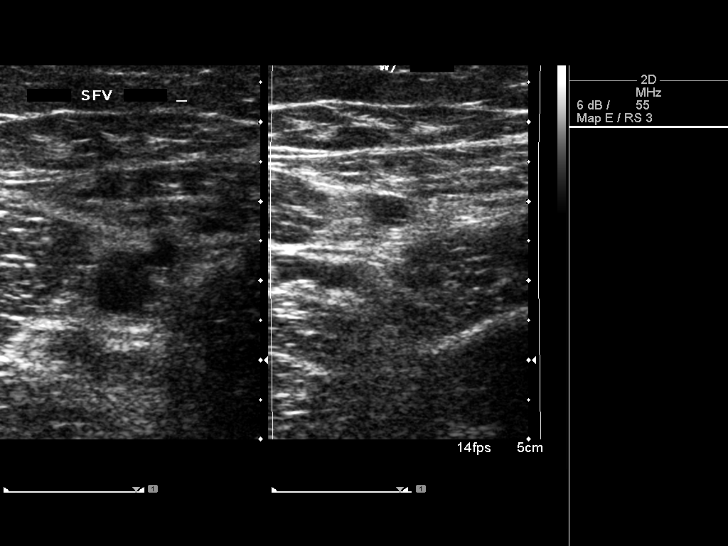
[im 5/27]
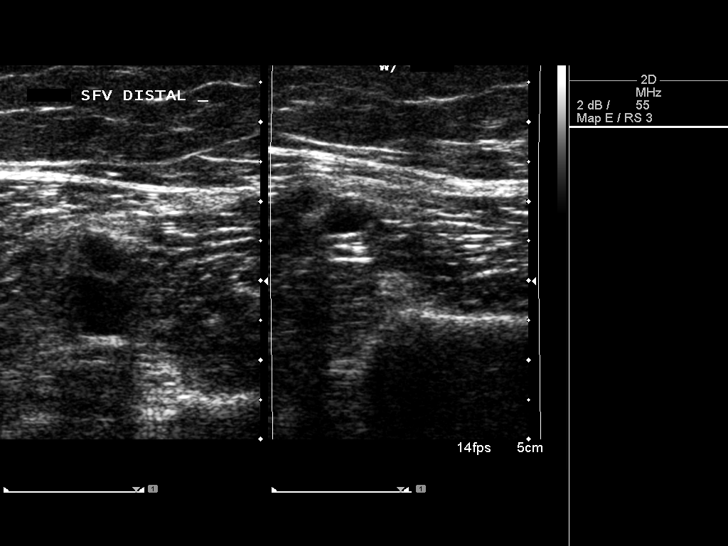
[im 7/27]
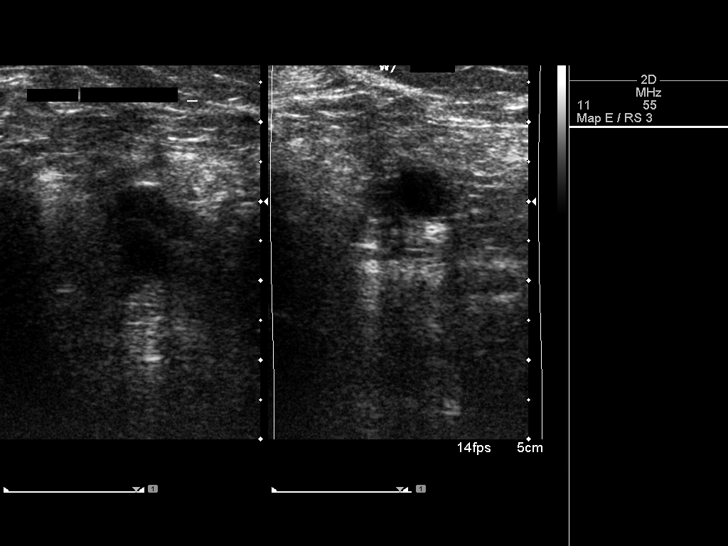
[im 10/27]
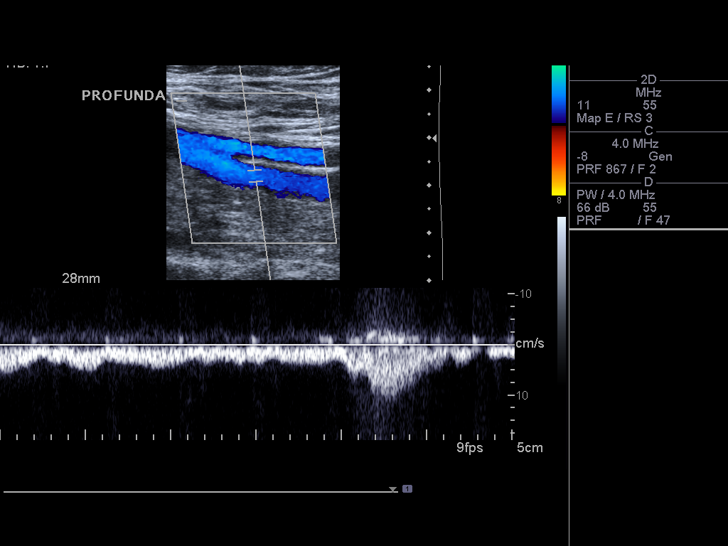
[im 12/27]
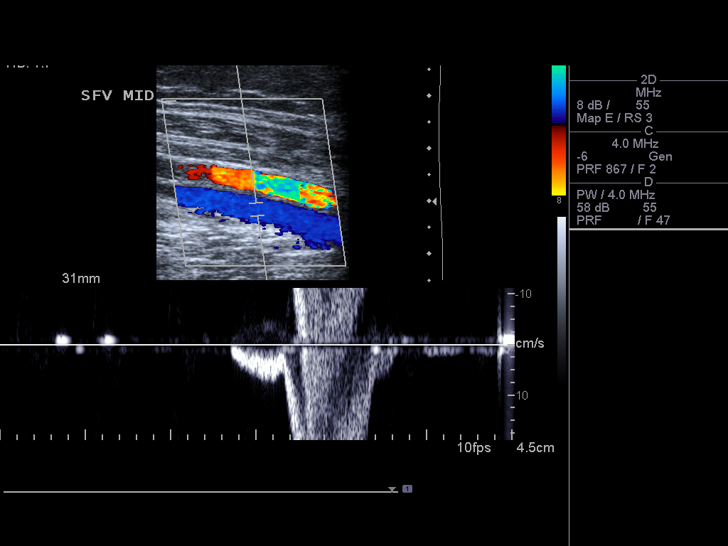
[im 14/27]
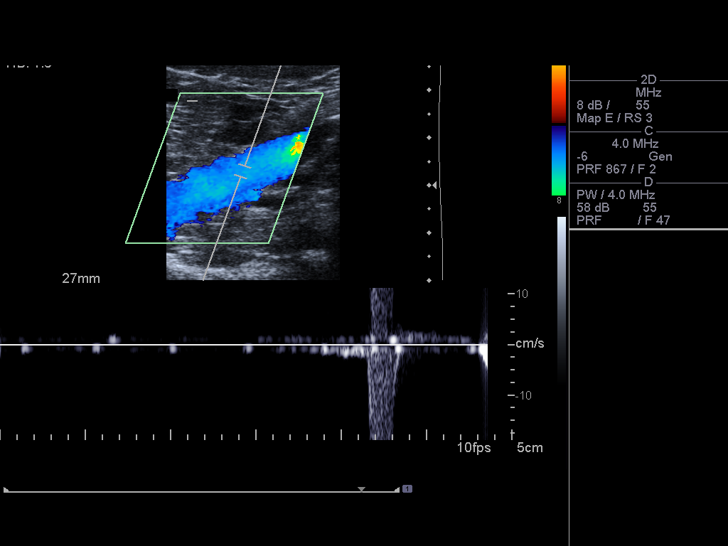
[im 15/27]
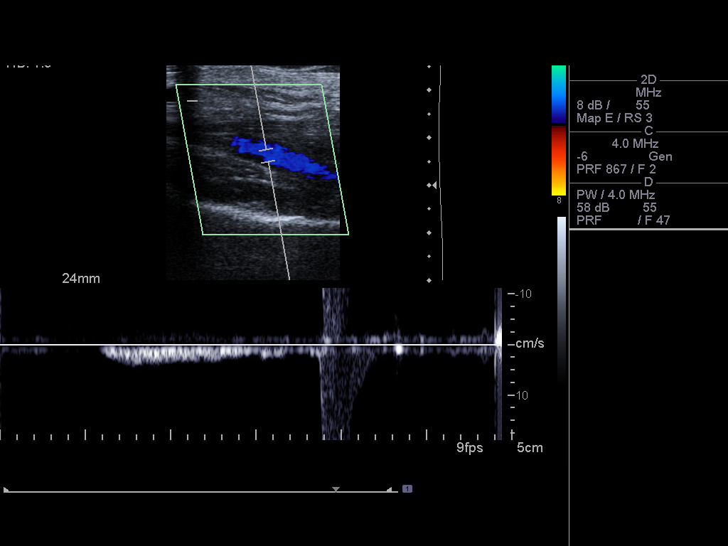
[im 17/27]
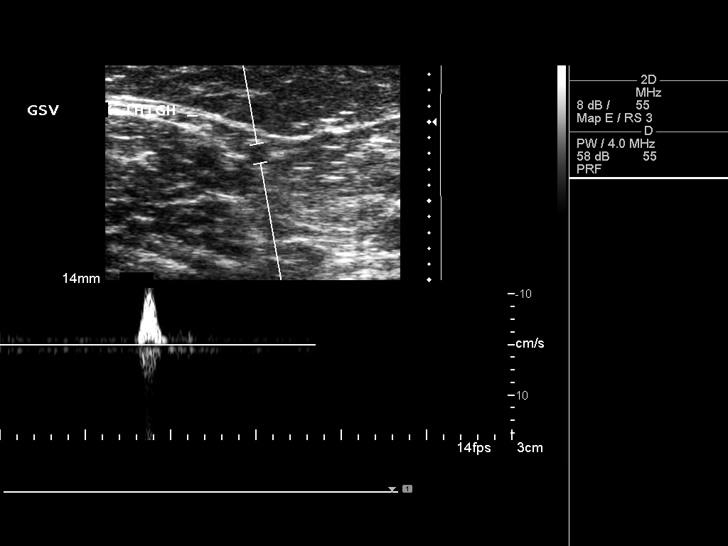
[im 20/27]
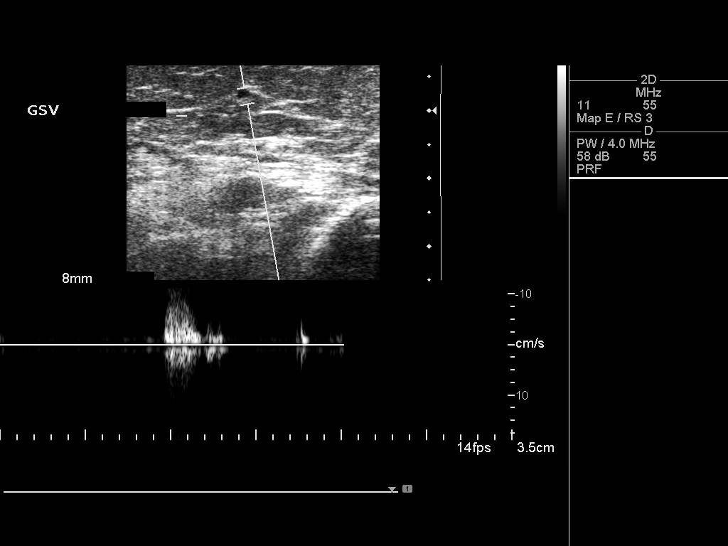
[im 22/27]
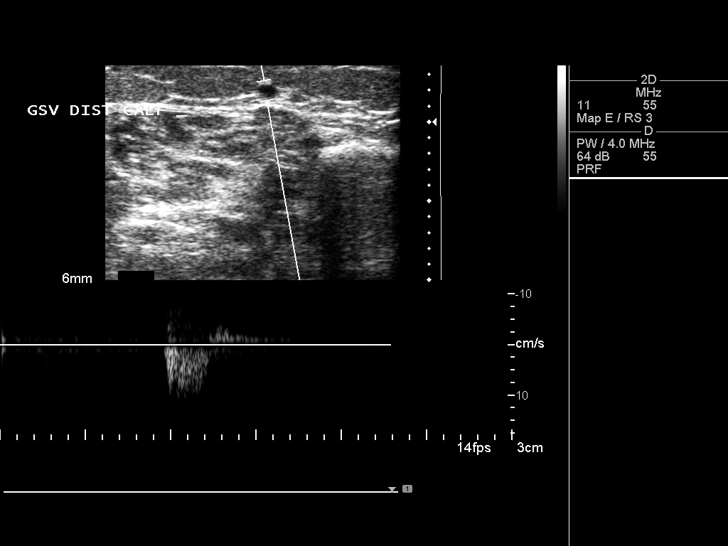
[im 24/27]
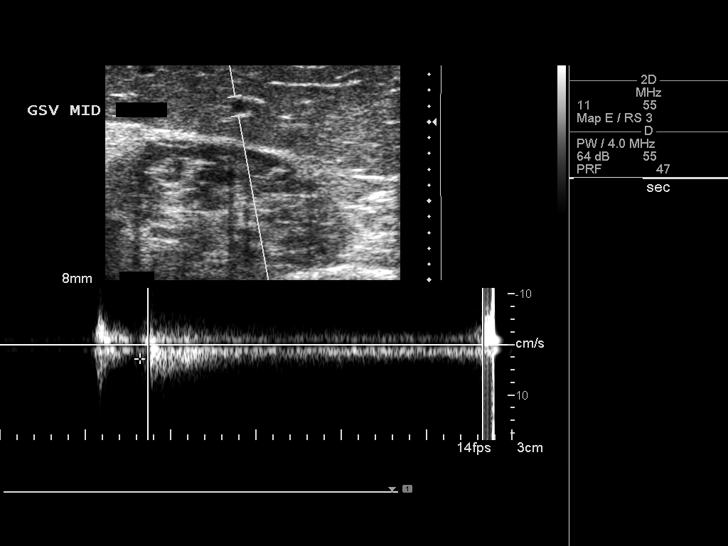
[im 27/27]
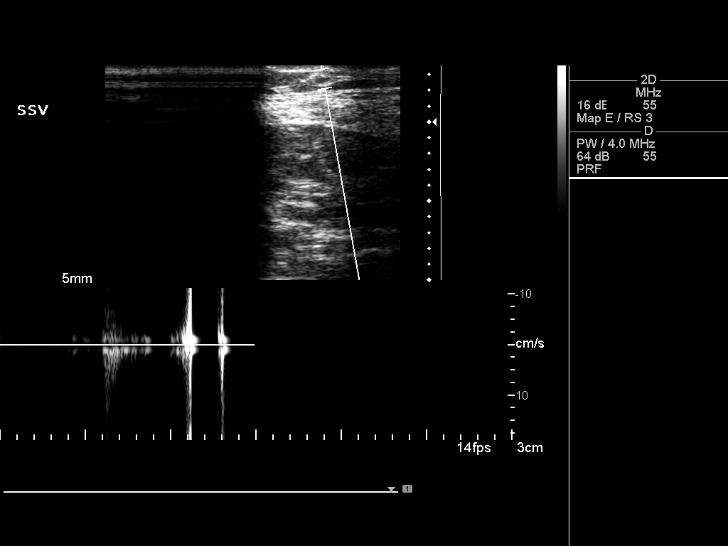

[13 of 24 positions shown; findings below may reference images not displayed]

FINDINGS: Deep Venous System:

Evaluation of the deep venous system including the common femoral,
femoral, profunda femoral, popliteal and calf veins (where visible)
demonstrate no evidence of deep venous thrombosis. The vessels are
compressible and demonstrate normal respiratory phasicity and
response to augmentation. No evidence of deep venous reflux.

Superficial Venous System:

SFJ: Within normal limits

GSV Prox Thigh: Within normal limits

GSV Mid Thigh: Within normal limits

GSV Lower Thigh: Within normal limits

GSV at Knee: Within normal limit

GSV Prox Calf: Within normal limit

GSV Mid Calf: Focal insufficiency lasting for 3.9 seconds.

GSV Distal Calf: Within normal limits

SPJ: Not present. The small saphenous vein continues posteriorly up
the thigh.

SSV Prox: Within normal limits

SSV Mid: Within normal limits

SSV Distal: Within normal limits

Other: None.
IMPRESSION: 1. Positive for focal venous insufficiency involving a short segment
of the left great saphenous vein at the mid calf. The remainder of
the great saphenous vein is normal.
2. No evidence of deep venous thrombosis.

## 2017-01-09 NOTE — Anesthesia Preprocedure Evaluation (Signed)
Anesthesia Evaluation  Patient identified by MRN, date of birth, ID band Patient awake    Reviewed: Allergy & Precautions, NPO status , Patient's Chart, lab work & pertinent test results  History of Anesthesia Complications Negative for: history of anesthetic complications  Airway Mallampati: II  TM Distance: >3 FB Neck ROM: Full    Dental no notable dental hx. (+) Dental Advisory Given   Pulmonary neg pulmonary ROS,    Pulmonary exam normal        Cardiovascular negative cardio ROS Normal cardiovascular exam     Neuro/Psych negative neurological ROS  negative psych ROS   GI/Hepatic negative GI ROS, Neg liver ROS,   Endo/Other  negative endocrine ROS  Renal/GU negative Renal ROS  negative genitourinary   Musculoskeletal negative musculoskeletal ROS (+)   Abdominal   Peds negative pediatric ROS (+)  Hematology negative hematology ROS (+)   Anesthesia Other Findings   Reproductive/Obstetrics negative OB ROS                             Anesthesia Physical  Anesthesia Plan  ASA: II  Anesthesia Plan: General   Post-op Pain Management:    Induction: Intravenous  PONV Risk Score and Plan: 3 and Ondansetron, Dexamethasone, Treatment may vary due to age or medical condition and Propofol infusion  Airway Management Planned: LMA  Additional Equipment:   Intra-op Plan:   Post-operative Plan: Extubation in OR  Informed Consent: I have reviewed the patients History and Physical, chart, labs and discussed the procedure including the risks, benefits and alternatives for the proposed anesthesia with the patient or authorized representative who has indicated his/her understanding and acceptance.   Dental advisory given  Plan Discussed with: CRNA, Anesthesiologist and Surgeon  Anesthesia Plan Comments: (Pt does not want scop patch)        Anesthesia Quick Evaluation

## 2017-01-10 ENCOUNTER — Encounter (HOSPITAL_BASED_OUTPATIENT_CLINIC_OR_DEPARTMENT_OTHER)
Admission: RE | Disposition: A | Discharge status: Home / Self Care | Payer: Self-pay | Source: Ambulatory Visit | Attending: Orthopedic Surgery

## 2017-01-10 ENCOUNTER — Ambulatory Visit (HOSPITAL_BASED_OUTPATIENT_CLINIC_OR_DEPARTMENT_OTHER): Payer: BLUE CROSS/BLUE SHIELD | Admitting: Anesthesiology

## 2017-01-10 ENCOUNTER — Other Ambulatory Visit: Payer: Self-pay

## 2017-01-10 ENCOUNTER — Ambulatory Visit (HOSPITAL_BASED_OUTPATIENT_CLINIC_OR_DEPARTMENT_OTHER)
Admission: RE | Admit: 2017-01-10 | Discharge: 2017-01-10 | Disposition: A | Discharge status: Home / Self Care | Payer: BLUE CROSS/BLUE SHIELD | Source: Ambulatory Visit | Attending: Orthopedic Surgery | Admitting: Orthopedic Surgery

## 2017-01-10 ENCOUNTER — Encounter (HOSPITAL_BASED_OUTPATIENT_CLINIC_OR_DEPARTMENT_OTHER): Payer: Self-pay | Admitting: *Deleted

## 2017-01-10 DIAGNOSIS — Z79899 Other long term (current) drug therapy: Secondary | ICD-10-CM | POA: Diagnosis not present

## 2017-01-10 DIAGNOSIS — M25871 Other specified joint disorders, right ankle and foot: Secondary | ICD-10-CM | POA: Diagnosis not present

## 2017-01-10 DIAGNOSIS — L985 Mucinosis of the skin: Secondary | ICD-10-CM | POA: Diagnosis not present

## 2017-01-10 DIAGNOSIS — I739 Peripheral vascular disease, unspecified: Secondary | ICD-10-CM | POA: Diagnosis not present

## 2017-01-10 DIAGNOSIS — Z9889 Other specified postprocedural states: Secondary | ICD-10-CM

## 2017-01-10 DIAGNOSIS — M71371 Other bursal cyst, right ankle and foot: Secondary | ICD-10-CM | POA: Diagnosis not present

## 2017-01-10 DIAGNOSIS — E785 Hyperlipidemia, unspecified: Secondary | ICD-10-CM | POA: Insufficient documentation

## 2017-01-10 DIAGNOSIS — M67471 Ganglion, right ankle and foot: Secondary | ICD-10-CM | POA: Diagnosis not present

## 2017-01-10 HISTORY — DX: Hyperlipidemia, unspecified: E78.5

## 2017-01-10 HISTORY — PX: GANGLION CYST EXCISION: SHX1691

## 2017-01-10 HISTORY — DX: Ganglion, unspecified ankle and foot: M67.479

## 2017-01-10 HISTORY — DX: Peripheral vascular disease, unspecified: I73.9

## 2017-01-10 HISTORY — DX: Dental restoration status: Z98.811

## 2017-01-10 SURGERY — EXCISION, GANGLION CYST, FOOT
Anesthesia: General | Site: Foot | Laterality: Right

## 2017-01-10 MED ORDER — ONDANSETRON HCL 4 MG/2ML IJ SOLN: 4.0000 mg | Freq: Once | INTRAMUSCULAR | Status: DC | PRN

## 2017-01-10 MED ORDER — OXYCODONE HCL 5 MG PO TABS: 5.0000 mg | ORAL_TABLET | Freq: Once | ORAL | Status: DC | PRN

## 2017-01-10 MED ORDER — MEPERIDINE HCL 25 MG/ML IJ SOLN: 6.2500 mg | INTRAMUSCULAR | Status: DC | PRN

## 2017-01-10 MED ORDER — KETOROLAC TROMETHAMINE 30 MG/ML IJ SOLN: 30.0000 mg | Freq: Once | INTRAMUSCULAR | Status: DC | PRN

## 2017-01-10 MED ORDER — BACITRACIN ZINC 500 UNIT/GM EX OINT
TOPICAL_OINTMENT | CUTANEOUS | Status: AC
  Filled 2017-01-10: qty 0.9

## 2017-01-10 MED ORDER — PROPOFOL 500 MG/50ML IV EMUL
INTRAVENOUS | Status: DC | PRN
  Administered 2017-01-10: 100 ug/kg/min via INTRAVENOUS

## 2017-01-10 MED ORDER — CHLORHEXIDINE GLUCONATE 4 % EX LIQD: 60.0000 mL | Freq: Once | CUTANEOUS | Status: DC

## 2017-01-10 MED ORDER — MIDAZOLAM HCL 2 MG/2ML IJ SOLN
1.0000 mg | INTRAMUSCULAR | Status: DC | PRN
  Administered 2017-01-10: 2 mg via INTRAVENOUS

## 2017-01-10 MED ORDER — LIDOCAINE HCL 2 % IJ SOLN
INTRAMUSCULAR | Status: AC
  Filled 2017-01-10: qty 20

## 2017-01-10 MED ORDER — SODIUM CHLORIDE 0.9 % IV SOLN: INTRAVENOUS | Status: DC

## 2017-01-10 MED ORDER — ACETAMINOPHEN 160 MG/5ML PO SOLN: 325.0000 mg | ORAL | Status: DC | PRN

## 2017-01-10 MED ORDER — MIDAZOLAM HCL 2 MG/2ML IJ SOLN
INTRAMUSCULAR | Status: AC
  Filled 2017-01-10: qty 2

## 2017-01-10 MED ORDER — BUPIVACAINE-EPINEPHRINE 0.25% -1:200000 IJ SOLN
INTRAMUSCULAR | Status: AC
  Filled 2017-01-10: qty 1

## 2017-01-10 MED ORDER — BUPIVACAINE HCL (PF) 0.25 % IJ SOLN
INTRAMUSCULAR | Status: AC
  Filled 2017-01-10: qty 30

## 2017-01-10 MED ORDER — CEFAZOLIN SODIUM-DEXTROSE 2-4 GM/100ML-% IV SOLN
INTRAVENOUS | Status: AC
  Filled 2017-01-10: qty 100

## 2017-01-10 MED ORDER — LACTATED RINGERS IV SOLN
INTRAVENOUS | Status: DC
  Administered 2017-01-10: 07:00:00 via INTRAVENOUS

## 2017-01-10 MED ORDER — BUPIVACAINE-EPINEPHRINE (PF) 0.5% -1:200000 IJ SOLN
INTRAMUSCULAR | Status: AC
  Filled 2017-01-10: qty 30

## 2017-01-10 MED ORDER — FENTANYL CITRATE (PF) 100 MCG/2ML IJ SOLN
50.0000 ug | INTRAMUSCULAR | Status: DC | PRN
  Administered 2017-01-10 (×2): 50 ug via INTRAVENOUS

## 2017-01-10 MED ORDER — LIDOCAINE-EPINEPHRINE 2 %-1:100000 IJ SOLN
INTRAMUSCULAR | Status: AC
  Filled 2017-01-10: qty 1

## 2017-01-10 MED ORDER — FENTANYL CITRATE (PF) 100 MCG/2ML IJ SOLN: 25.0000 ug | INTRAMUSCULAR | Status: DC | PRN

## 2017-01-10 MED ORDER — BUPIVACAINE-EPINEPHRINE 0.5% -1:200000 IJ SOLN
INTRAMUSCULAR | Status: DC | PRN
  Administered 2017-01-10: 10 mL

## 2017-01-10 MED ORDER — BUPIVACAINE HCL (PF) 0.5 % IJ SOLN
INTRAMUSCULAR | Status: AC
  Filled 2017-01-10: qty 30

## 2017-01-10 MED ORDER — CEFAZOLIN SODIUM-DEXTROSE 2-4 GM/100ML-% IV SOLN
2.0000 g | INTRAVENOUS | Status: AC
  Administered 2017-01-10: 2 g via INTRAVENOUS

## 2017-01-10 MED ORDER — SCOPOLAMINE 1 MG/3DAYS TD PT72: 1.0000 | MEDICATED_PATCH | Freq: Once | TRANSDERMAL | Status: DC | PRN

## 2017-01-10 MED ORDER — BACITRACIN ZINC 500 UNIT/GM EX OINT
TOPICAL_OINTMENT | CUTANEOUS | Status: AC
  Filled 2017-01-10: qty 28.35

## 2017-01-10 MED ORDER — PHENYLEPHRINE HCL 10 MG/ML IJ SOLN
INTRAMUSCULAR | Status: DC | PRN
  Administered 2017-01-10 (×2): 120 ug via INTRAVENOUS

## 2017-01-10 MED ORDER — ACETAMINOPHEN 325 MG PO TABS: 325.0000 mg | ORAL_TABLET | ORAL | Status: DC | PRN

## 2017-01-10 MED ORDER — LIDOCAINE-EPINEPHRINE 1 %-1:100000 IJ SOLN
INTRAMUSCULAR | Status: AC
  Filled 2017-01-10: qty 1

## 2017-01-10 MED ORDER — OXYCODONE HCL 5 MG/5ML PO SOLN: 5.0000 mg | Freq: Once | ORAL | Status: DC | PRN

## 2017-01-10 MED ORDER — TRAMADOL HCL 50 MG PO TABS: 50.0000 mg | ORAL_TABLET | Freq: Four times a day (QID) | ORAL | 0 refills | Status: DC | PRN

## 2017-01-10 MED ORDER — ONDANSETRON HCL 4 MG/2ML IJ SOLN
INTRAMUSCULAR | Status: AC
  Filled 2017-01-10: qty 2

## 2017-01-10 MED ORDER — LIDOCAINE HCL (PF) 1 % IJ SOLN
INTRAMUSCULAR | Status: AC
  Filled 2017-01-10: qty 30

## 2017-01-10 MED ORDER — FENTANYL CITRATE (PF) 100 MCG/2ML IJ SOLN
INTRAMUSCULAR | Status: AC
  Filled 2017-01-10: qty 2

## 2017-01-10 SURGICAL SUPPLY — 62 items
BANDAGE ESMARK 6X9 LF (GAUZE/BANDAGES/DRESSINGS) IMPLANT
BLADE MINI RND TIP GREEN BEAV (BLADE) IMPLANT
BLADE SURG 15 STRL LF DISP TIS (BLADE) ×1 IMPLANT
BLADE SURG 15 STRL SS (BLADE) ×2
BNDG CMPR 9X4 STRL LF SNTH (GAUZE/BANDAGES/DRESSINGS) ×1
BNDG CMPR 9X6 STRL LF SNTH (GAUZE/BANDAGES/DRESSINGS)
BNDG COHESIVE 4X5 TAN STRL (GAUZE/BANDAGES/DRESSINGS) ×1 IMPLANT
BNDG COHESIVE 6X5 TAN STRL LF (GAUZE/BANDAGES/DRESSINGS) IMPLANT
BNDG CONFORM 3 STRL LF (GAUZE/BANDAGES/DRESSINGS) ×1 IMPLANT
BNDG ESMARK 4X9 LF (GAUZE/BANDAGES/DRESSINGS) ×1 IMPLANT
BNDG ESMARK 6X9 LF (GAUZE/BANDAGES/DRESSINGS)
CHLORAPREP W/TINT 26ML (MISCELLANEOUS) ×2 IMPLANT
COVER BACK TABLE 60X90IN (DRAPES) ×2 IMPLANT
CUFF TOURNIQUET SINGLE 24IN (TOURNIQUET CUFF) IMPLANT
CUFF TOURNIQUET SINGLE 34IN LL (TOURNIQUET CUFF) IMPLANT
DRAPE EXTREMITY T 121X128X90 (DRAPE) ×2 IMPLANT
DRAPE OEC MINIVIEW 54X84 (DRAPES) IMPLANT
DRAPE SURG 17X23 STRL (DRAPES) ×1 IMPLANT
DRAPE U-SHAPE 47X51 STRL (DRAPES) IMPLANT
DRSG MEPITEL 4X7.2 (GAUZE/BANDAGES/DRESSINGS) IMPLANT
DRSG PAD ABDOMINAL 8X10 ST (GAUZE/BANDAGES/DRESSINGS) ×2 IMPLANT
ELECT REM PT RETURN 9FT ADLT (ELECTROSURGICAL) ×2
ELECTRODE REM PT RTRN 9FT ADLT (ELECTROSURGICAL) ×1 IMPLANT
GAUZE SPONGE 4X4 12PLY STRL (GAUZE/BANDAGES/DRESSINGS) ×3 IMPLANT
GAUZE SPONGE 4X4 16PLY XRAY LF (GAUZE/BANDAGES/DRESSINGS) IMPLANT
GLOVE BIO SURGEON STRL SZ8 (GLOVE) ×2 IMPLANT
GLOVE BIOGEL PI IND STRL 8 (GLOVE) ×2 IMPLANT
GLOVE BIOGEL PI INDICATOR 8 (GLOVE) ×2
GLOVE ECLIPSE 8.0 STRL XLNG CF (GLOVE) ×2 IMPLANT
GOWN STRL REUS W/ TWL LRG LVL3 (GOWN DISPOSABLE) ×1 IMPLANT
GOWN STRL REUS W/ TWL XL LVL3 (GOWN DISPOSABLE) ×2 IMPLANT
GOWN STRL REUS W/TWL LRG LVL3 (GOWN DISPOSABLE)
GOWN STRL REUS W/TWL XL LVL3 (GOWN DISPOSABLE) ×6
NDL HYPO 25X1 1.5 SAFETY (NEEDLE) IMPLANT
NEEDLE HYPO 22GX1.5 SAFETY (NEEDLE) IMPLANT
NEEDLE HYPO 25X1 1.5 SAFETY (NEEDLE) ×2 IMPLANT
NS IRRIG 1000ML POUR BTL (IV SOLUTION) ×2 IMPLANT
PACK BASIN DAY SURGERY FS (CUSTOM PROCEDURE TRAY) ×2 IMPLANT
PAD CAST 4YDX4 CTTN HI CHSV (CAST SUPPLIES) ×1 IMPLANT
PADDING CAST COTTON 4X4 STRL (CAST SUPPLIES) ×2
PADDING CAST COTTON 6X4 STRL (CAST SUPPLIES) IMPLANT
PENCIL BUTTON HOLSTER BLD 10FT (ELECTRODE) ×2 IMPLANT
SANITIZER HAND PURELL 535ML FO (MISCELLANEOUS) ×2 IMPLANT
SHEET MEDIUM DRAPE 40X70 STRL (DRAPES) ×2 IMPLANT
SLEEVE SCD COMPRESS KNEE MED (MISCELLANEOUS) ×1 IMPLANT
SPONGE LAP 18X18 X RAY DECT (DISPOSABLE) ×2 IMPLANT
STOCKINETTE 6  STRL (DRAPES) ×1
STOCKINETTE 6 STRL (DRAPES) ×1 IMPLANT
SUCTION FRAZIER HANDLE 10FR (MISCELLANEOUS)
SUCTION TUBE FRAZIER 10FR DISP (MISCELLANEOUS) IMPLANT
SUT ETHILON 3 0 PS 1 (SUTURE) ×1 IMPLANT
SUT ETHILON 4 0 PS 2 18 (SUTURE) IMPLANT
SUT MNCRL AB 3-0 PS2 18 (SUTURE) IMPLANT
SUT VIC AB 0 SH 27 (SUTURE) IMPLANT
SUT VIC AB 2-0 SH 27 (SUTURE)
SUT VIC AB 2-0 SH 27XBRD (SUTURE) IMPLANT
SYR BULB 3OZ (MISCELLANEOUS) ×2 IMPLANT
SYR CONTROL 10ML LL (SYRINGE) ×1 IMPLANT
TOWEL OR 17X24 6PK STRL BLUE (TOWEL DISPOSABLE) ×2 IMPLANT
TUBE CONNECTING 20X1/4 (TUBING) IMPLANT
UNDERPAD 30X30 (UNDERPADS AND DIAPERS) ×2 IMPLANT
YANKAUER SUCT BULB TIP NO VENT (SUCTIONS) IMPLANT

## 2017-01-10 NOTE — H&P (Signed)
Rebekah Villanueva is an 61 y.o. female.   Chief Complaint: Right foot mass HPI: The patient is a 61 year old woman who underwent excision of a mucous cyst from her right hallux several months ago.  The cyst has recurred.  She presents now for revision excision of the mass having failed nonoperative treatment to date.  Past Medical History:  Diagnosis Date  . Dental crowns present   . Dyslipidemia   . Mucous cyst of toe 12/2016   right great toe  . Peripheral vascular disease [facility])     Past Surgical History:  Procedure Laterality Date  . CARPAL TUNNEL RELEASE Right 08/24/2014   Procedure:  RIGHT CARPAL TUNNEL RELEASE;  Surgeon: Daryll Brod, MD;  Location: [place];  Service: Orthopedics;  Laterality: Right;  REGIONAL/FAB  . CARPAL TUNNEL RELEASE Left 06/2015  . CESAREAN SECTION  1996  . CYST EXCISION Right 08/23/2016   Procedure: Right hallux excision of mucous cyst and local soft tissue flap rearrangement;  Surgeon: Wylene Simmer, MD;  Location: [place];  Service: Orthopedics;  Laterality: Right;  . DILATATION & CURETTAGE/HYSTEROSCOPY WITH MYOSURE N/A 10/03/2016   Procedure: [place];  Surgeon: [name], MD;  Location: [place];  Service: Gynecology;  Laterality: N/A;  . MYOMECTOMY  1994  . PELVIC LAPAROSCOPY  ~ 1992  . TONSILLECTOMY AND ADENOIDECTOMY  age 75    Family History  Problem Relation Age of Onset  . Diabetes Mother   . Hypertension Mother   . Hyperlipidemia Mother   . Hyperlipidemia Sister    Social History:  reports that  has never smoked. she has never used smokeless tobacco. She reports that she drinks alcohol. She reports that she does not use drugs.  Allergies:  Allergies  Allergen Reactions  . Oxycodone Itching    Medications Prior to Admission  Medication Sig Dispense Refill  . atorvastatin (LIPITOR) 40 MG tablet Take 1 tablet (40 mg total) by mouth daily. 90  tablet 3  . estradiol (ESTRACE) 1 MG tablet Take 1 mg by mouth daily.    . Multiple Vitamin (MULTIVITAMIN) tablet Take 1 tablet by mouth daily.    . Multiple Vitamins-Minerals (HAIR SKIN AND NAILS FORMULA) TABS Take 1 tablet by mouth.    . progesterone (PROMETRIUM) 100 MG capsule Take 1 capsule (100 mg total) by mouth at bedtime. 90 capsule 4    No results found for this or any previous visit (from the past 48 hour(s)). No results found.  ROS no recent fever, chills, nausea, vomiting or changes in her appetite.  Blood pressure (!) 103/48, pulse 65, temperature 97.9 F (36.6 C), temperature source Oral, resp. rate 15, height 5\' 2"  (1.575 m), weight 60.1 kg (132 lb 6.4 oz), SpO2 100 %. Physical Exam  Well-nourished well-developed woman in no apparent distress.  Alert and oriented x4.  Mood and affect are normal.  Extraocular motions are intact.  Respirations are unlabored.  Her gait is normal.  The right hallux has a small mucous cyst at the IP joint medially.  Skin is otherwise healthy and intact.  No lymphadenopathy.  Pulses are palpable.  Sensibility to light touch is normal throughout the foot.  5 out of 5 strength in plantar flexion and dorsiflexion of the toe at the IP and MP joints.  Assessment/Plan Right hallux mucous cyst-to operating room today for revision excision.  The risks and benefits of the alternative treatment options have been discussed in detail.  The patient wishes to proceed with surgery and specifically understands risks of bleeding, infection, nerve damage, blood clots, need for additional surgery, amputation and death.   Wylene Simmer, MD 02/01/2017, 7:24 AM

## 2017-01-10 NOTE — Transfer of Care (Signed)
Immediate Anesthesia Transfer of Care Note  Patient: Rebekah Villanueva  Procedure(s) Performed: Excision of right hallux mucous cyst (Right Foot)  Patient Location: PACU  Anesthesia Type:MAC  Level of Consciousness: awake, alert  and oriented  Airway & Oxygen Therapy: Patient Spontanous Breathing and Patient connected to face mask oxygen  Post-op Assessment: Report given to RN and Post -op Vital signs reviewed and stable  Post vital signs: Reviewed and stable  Last Vitals:  Vitals:   01/10/17 0636  BP: (!) 103/48  Pulse: 65  Resp: 15  Temp: 36.6 C  SpO2: 100%    Last Pain:  Vitals:   01/10/17 0636  TempSrc: Oral  PainSc: 0-No pain      Patients Stated Pain Goal: 0 (14/43/15 4008)  Complications: No apparent anesthesia complications

## 2017-01-10 NOTE — Anesthesia Postprocedure Evaluation (Signed)
Anesthesia Post Note  Patient: Rebekah Villanueva  Procedure(s) Performed: Excision of right hallux mucous cyst (Right Foot)     Patient location during evaluation: PACU Anesthesia Type: MAC Level of consciousness: awake and alert Pain management: pain level controlled Vital Signs Assessment: post-procedure vital signs reviewed and stable Respiratory status: spontaneous breathing, nonlabored ventilation, respiratory function stable and patient connected to nasal cannula oxygen Cardiovascular status: stable and blood pressure returned to baseline Postop Assessment: no apparent nausea or vomiting Anesthetic complications: no    Last Vitals:  Vitals:   01/10/17 0812 01/10/17 0815  BP: (!) 84/47 (!) 94/47  Pulse: 68 63  Resp: (!) 8 15  Temp: (!) 36.3 C   SpO2: 100% 97%    Last Pain:  Vitals:   01/10/17 0812  TempSrc:   PainSc: 0-No pain                 Carrisa Keller

## 2017-01-10 NOTE — Discharge Instructions (Addendum)
Rebekah Simmer, MD [place]  Please read the following information regarding your care after surgery.  Medications  You only need a prescription for the narcotic pain medicine (ex. oxycodone, Percocet, Norco).  All of the other medicines listed below are available over the counter. X Aleve 2 pills twice a day for the first 3 days after surgery. X acetominophen (Tylenol) 650 mg every 4-6 hours as you need for minor to moderate pain X tramadol as prescribed for severe pain    Weight Bearing X Bear weight when you are able on your operated leg or foot in the flat post-op shoe.  Cast / Splint / Dressing X Remove your dressing 3 days after surgery and cover the incisions with dry dressings.    After your dressing, cast or splint is removed; you may shower, but do not soak or scrub the wound.  Allow the water to run over it, and then gently pat it dry.  Swelling It is normal for you to have swelling where you had surgery.  To reduce swelling and pain, keep your toes above your nose for at least 3 days after surgery.  It may be necessary to keep your foot or leg elevated for several weeks.  If it hurts, it should be elevated.  Follow Up Call my office at [phone] when you are discharged from the hospital or surgery center to schedule an appointment to be seen two weeks after surgery.  Call my office at [phone] if you develop a fever >101.5 F, nausea, vomiting, bleeding from the surgical site or severe pain.      Post Anesthesia Home Care Instructions  Activity: Get plenty of rest for the remainder of the day. A responsible individual must stay with you for 24 hours following the procedure.  For the next 24 hours, DO NOT: -Drive a car -Paediatric nurse -Drink alcoholic beverages -Take any medication unless instructed by your physician -Make any legal decisions or sign important papers.  Meals: Start with liquid foods such as gelatin or soup. Progress to  regular foods as tolerated. Avoid greasy, spicy, heavy foods. If nausea and/or vomiting occur, drink only clear liquids until the nausea and/or vomiting subsides. Call your physician if vomiting continues.  Special Instructions/Symptoms: Your throat may feel dry or sore from the anesthesia or the breathing tube placed in your throat during surgery. If this causes discomfort, gargle with warm salt water. The discomfort should disappear within 24 hours.  If you had a scopolamine patch placed behind your ear for the management of post- operative nausea and/or vomiting:  1. The medication in the patch is effective for 72 hours, after which it should be removed.  Wrap patch in a tissue and discard in the trash. Wash hands thoroughly with soap and water. 2. You may remove the patch earlier than 72 hours if you experience unpleasant side effects which may include dry mouth, dizziness or visual disturbances. 3. Avoid touching the patch. Wash your hands with soap and water after contact with the patch.

## 2017-01-10 NOTE — Op Note (Signed)
01/10/2017  8:10 AM  PATIENT:  Rebekah Villanueva  61 y.o. female  PRE-OPERATIVE DIAGNOSIS:  Right hallux recurrent mucous cyst  POST-OPERATIVE DIAGNOSIS:  Right hallux recurrent mucous cyst  Procedure(s): 1.  Excision of right hallux mucous cyst 61 cm x 6 mm 2.  Local soft tissue rearrangement  SURGEON:  Wylene Simmer, MD  ASSISTANT: Mechele Claude, PA-C  ANESTHESIA:   Local, MAC  EBL:  minimal   TOURNIQUET:  10 min with ankle esmarch  COMPLICATIONS:  None apparent  DISPOSITION:  Extubated, awake and stable to recovery.  INDICATION FOR PROCEDURE: The patient is a 61 year old woman with recurrence of her right hallux mucous cyst after excision in August of this year.  She has failed nonoperative treatment and presents today for revision excision.The risks and benefits of the alternative treatment options have been discussed in detail.  The patient wishes to proceed with surgery and specifically understands risks of bleeding, infection, nerve damage, blood clots, need for additional surgery, amputation and death.  PROCEDURE IN DETAIL:  After pre operative consent was obtained, and the correct operative site was identified, the patient was brought to the operating room and placed supine on the OR table.  Anesthesia was administered.  Pre-operative antibiotics were administered.  A surgical timeout was taken.  Right lower extremity was prepped and draped in standard sterile fashion.  A digital block was performed with half percent Marcaine with epinephrine.  The foot was exsanguinated and a 4 inch ankle Esmarch tourniquet wrapped around the ankle.  The mucous cyst was identified at the medial aspect of the interphalangeal joint of the right hallux.  An ellipsoid incision was made around the mass which measured 1 cm x 6 mm.  Dissection was carried down through the subtenons tissues to the level of the interphalangeal joint.  The mass was excised in its entirety and passed off the field as a specimen  to pathology.  The stalk of the cyst was identified as it tracked into the IP joint.  This was excised completely.  The interphalangeal joint was opened and carefully inspected.  There was no significant degenerative change identified.  The excised area was too large to cover with simple wound closure.  The decision was made to proceed with local flap coverage.  An incision was then made transversely across the IP joint taking care to protect the extensor tendon.  The incision was then carried from the wound proximally along the mid lateral aspect of the toe and then transversely across the MP joint.  A full-thickness flap was then elevated off of the extensor mechanism.  The soft tissues proximally and medially were released.  The flap was then rotated distally and medially to fill in the wound.  It was sutured in place with horizontal mattress sutures of 4-0 nylon.  Proximally the wound was closed with 4-0 nylon horizontal mattress sutures.  The tourniquet was released and the flap was noted to be appropriately perfused.  The wound was irrigated prior to closure.  Sterile dressings were applied followed by compression wrap.  The patient was awakened from anesthesia and transported to the recovery room in stable condition.  FOLLOW UP PLAN: Weightbearing as tolerated in a postop shoe.  Follow-up in 2 weeks for suture removal.   Mechele Claude PA-C was present and scrubbed for the duration of the operative case. His assistance assistance was essential in positioning the patient, prepping and draping, gaining maintaining exposure, performing the operation, closing and dressing the wounds and  applying the splint.

## 2017-01-11 ENCOUNTER — Encounter (HOSPITAL_BASED_OUTPATIENT_CLINIC_OR_DEPARTMENT_OTHER): Payer: Self-pay | Admitting: Orthopedic Surgery

## 2017-01-11 NOTE — Addendum Note (Signed)
Addendum  created 01/11/17 9144 by Tawni Millers, CRNA   [facility]ge Capt[facility] section accepted

## 2017-01-25 DIAGNOSIS — M71371 Other bursal cyst, right ankle and foot: Secondary | ICD-10-CM | POA: Diagnosis not present

## 2017-02-13 ENCOUNTER — Telehealth: Payer: Self-pay | Admitting: Cardiology

## 2017-02-13 DIAGNOSIS — E785 Hyperlipidemia, unspecified: Secondary | ICD-10-CM

## 2017-02-13 DIAGNOSIS — E7800 Pure hypercholesterolemia, unspecified: Secondary | ICD-10-CM

## 2017-02-13 NOTE — Telephone Encounter (Signed)
Wants to know when her lab is due and what is a good multi vitamin to take

## 2017-02-14 NOTE — Telephone Encounter (Signed)
Left voicemail, that patient can go anytime to [facility] to have her lab done

## 2017-02-15 DIAGNOSIS — M79671 Pain in right foot: Secondary | ICD-10-CM | POA: Diagnosis not present

## 2017-02-22 ENCOUNTER — Ambulatory Visit (INDEPENDENT_AMBULATORY_CARE_PROVIDER_SITE_OTHER): Payer: BLUE CROSS/BLUE SHIELD | Admitting: Gynecology

## 2017-02-22 ENCOUNTER — Encounter: Payer: Self-pay | Admitting: Gynecology

## 2017-02-22 VITALS — BP 124/76 | Ht 62.0 in | Wt 136.0 lb

## 2017-02-22 DIAGNOSIS — Z79890 Hormone replacement therapy (postmenopausal): Secondary | ICD-10-CM

## 2017-02-22 DIAGNOSIS — M8580 Other specified disorders of bone density and structure, unspecified site: Secondary | ICD-10-CM | POA: Diagnosis not present

## 2017-02-22 DIAGNOSIS — N952 Postmenopausal atrophic vaginitis: Secondary | ICD-10-CM | POA: Diagnosis not present

## 2017-02-22 DIAGNOSIS — Z01411 Encounter for gynecological examination (general) (routine) with abnormal findings: Secondary | ICD-10-CM | POA: Diagnosis not present

## 2017-02-22 MED ORDER — PROGESTERONE MICRONIZED 100 MG PO CAPS: 100.0000 mg | ORAL_CAPSULE | Freq: Every day | ORAL | 4 refills | Status: DC

## 2017-02-22 MED ORDER — ESTRADIOL 1 MG PO TABS: 1.0000 mg | ORAL_TABLET | Freq: Every day | ORAL | 4 refills | Status: DC

## 2017-02-22 NOTE — Progress Notes (Signed)
    Rebekah Villanueva 03/13/1955 [id number]        62 y.o.  G2P1011 for annual gynecologic exam.  Doing well without gynecologic complaints.  Had hysteroscopy D&C for endometrial polyp last fall.  Has done no bleeding since.  Currently on HRT.  Past medical history,surgical history, problem list, medications, allergies, family history and social history were all reviewed and documented as reviewed in the EPIC chart.  ROS:  Performed with pertinent positives and negatives included in the history, assessment and plan.   Additional significant findings : None   Exam: Rebekah Villanueva assistant Vitals:   02/22/17 1030  BP: 124/76  Weight: 136 lb (61.7 kg)  Height: 5\' 2"  (1.575 m)   Body mass index is 24.87 kg/m.  General appearance:  Normal affect, orientation and appearance. Skin: Grossly normal HEENT: Without gross lesions.  No cervical or supraclavicular adenopathy. Thyroid normal.  Lungs:  Clear without wheezing, rales or rhonchi Cardiac: RR, without RMG Abdominal:  Soft, nontender, without masses, guarding, rebound, organomegaly or hernia Breasts:  Examined lying and sitting without masses, retractions, discharge or axillary adenopathy. Pelvic:  Ext, BUS, Vagina: With atrophic changes  Cervix: With atrophic changes  Uterus: Anteverted, generous in size midline mobile nontender  Adnexa: Without masses or tenderness    Anus and perineum: Normal   Rectovaginal: Normal sphincter tone without palpated masses or tenderness.    Assessment/Plan:  62 y.o. G31P1011 female for annual gynecologic exam.   1. Postmenopausal/atrophic genital changes/HRT.  Continues on estradiol 1 mg and Prometrium 100 mg nightly.  We again reviewed the risks versus benefits.  Stroke heart attack DVT and breast cancer issue discussed.  Symptom relief as well as possible cardiovascular and bone health also reviewed.  Is being followed for osteopenia.  At this point patient wants to continue but wants to try going back  to 0.5 mg daily.  Will stay on this if she tolerates from a symptom standpoint.  Refill estradiol and Prometrium times 1 year provided 2. Pap smear/HPV 2016.  No Pap smear done today.  No history of significant abnormal Pap smears.  Plan repeat Pap smear at 5-year interval per current screening guidelines. 3. Mammography now on patient agrees to call and schedule.  Breast exam normal today. 4. Colonoscopy 2016.  Repeat at their recommended interval. 5. Osteopenia.  DEXA 2016 T score -1.9 FRAX 6.9% / 0.6%.  Recommend baseline DEXA now and patient will schedule. 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  Stable on serial exams.  Patient is asymptomatic.  We will continue with annual exams to monitor. 7. Health maintenance.  No routine lab work done as patient does this elsewhere.  Follow-up for bone density otherwise follow-up in 1 year, sooner as needed.   [name] MD, 11:02 AM 02/22/2017

## 2017-02-22 NOTE — Patient Instructions (Signed)
Followup for bone density as scheduled. 

## 2017-04-18 ENCOUNTER — Other Ambulatory Visit: Payer: Self-pay | Admitting: Gynecology

## 2017-04-18 DIAGNOSIS — Z1231 Encounter for screening mammogram for malignant neoplasm of breast: Secondary | ICD-10-CM

## 2017-04-19 ENCOUNTER — Telehealth: Payer: Self-pay | Admitting: *Deleted

## 2017-04-19 NOTE — Telephone Encounter (Signed)
Patient returned call. Patient states she has not went to get lipid panel drawn yet. Patient states she will go to [place] within the next week to get it done.

## 2017-04-19 NOTE — Telephone Encounter (Signed)
Left message to return call. Following up to see if patient got her lab work drawn.

## 2017-05-06 ENCOUNTER — Other Ambulatory Visit: Payer: Self-pay | Admitting: Gynecology

## 2017-05-06 ENCOUNTER — Ambulatory Visit (INDEPENDENT_AMBULATORY_CARE_PROVIDER_SITE_OTHER): Payer: BLUE CROSS/BLUE SHIELD

## 2017-05-06 DIAGNOSIS — E7800 Pure hypercholesterolemia, unspecified: Secondary | ICD-10-CM | POA: Diagnosis not present

## 2017-05-06 DIAGNOSIS — M8589 Other specified disorders of bone density and structure, multiple sites: Secondary | ICD-10-CM

## 2017-05-06 DIAGNOSIS — E785 Hyperlipidemia, unspecified: Secondary | ICD-10-CM | POA: Diagnosis not present

## 2017-05-06 DIAGNOSIS — M8580 Other specified disorders of bone density and structure, unspecified site: Secondary | ICD-10-CM

## 2017-05-07 ENCOUNTER — Encounter: Payer: Self-pay | Admitting: Gynecology

## 2017-05-07 LAB — LIPID PANEL
Chol/HDL Ratio: 2.7 ratio (ref 0.0–4.4)
Cholesterol, Total: 180 mg/dL (ref 100–199)
HDL: 66 mg/dL (ref >39)
LDL CALC: 85 mg/dL (ref 0–99)
TRIGLYCERIDES: 144 mg/dL (ref 0–149)
VLDL Cholesterol Cal: 29 mg/dL (ref 5–40)

## 2017-05-17 ENCOUNTER — Ambulatory Visit
Admission: RE | Admit: 2017-05-17 | Discharge: 2017-05-17 | Disposition: A | Discharge status: Home / Self Care | Payer: BLUE CROSS/BLUE SHIELD | Source: Ambulatory Visit | Attending: Gynecology | Admitting: Gynecology

## 2017-05-17 DIAGNOSIS — Z1231 Encounter for screening mammogram for malignant neoplasm of breast: Secondary | ICD-10-CM

## 2017-05-20 ENCOUNTER — Other Ambulatory Visit: Payer: Self-pay | Admitting: Gynecology

## 2017-05-20 DIAGNOSIS — R928 Other abnormal and inconclusive findings on diagnostic imaging of breast: Secondary | ICD-10-CM

## 2017-05-22 ENCOUNTER — Ambulatory Visit
Admission: RE | Admit: 2017-05-22 | Discharge: 2017-05-22 | Disposition: A | Discharge status: Home / Self Care | Payer: BLUE CROSS/BLUE SHIELD | Source: Ambulatory Visit | Attending: Gynecology | Admitting: Gynecology

## 2017-05-22 ENCOUNTER — Ambulatory Visit: Payer: BLUE CROSS/BLUE SHIELD

## 2017-05-22 DIAGNOSIS — R928 Other abnormal and inconclusive findings on diagnostic imaging of breast: Secondary | ICD-10-CM

## 2017-05-22 DIAGNOSIS — R922 Inconclusive mammogram: Secondary | ICD-10-CM | POA: Diagnosis not present

## 2017-05-24 ENCOUNTER — Other Ambulatory Visit: Payer: BLUE CROSS/BLUE SHIELD

## 2017-06-06 NOTE — Addendum Note (Signed)
Addended by: Austin Miles on: 06/06/2017 02:46 PM   Modules accepted: Orders

## 2017-06-11 DIAGNOSIS — L720 Epidermal cyst: Secondary | ICD-10-CM | POA: Diagnosis not present

## 2017-08-02 ENCOUNTER — Encounter: Payer: Self-pay | Admitting: Cardiology

## 2017-08-02 ENCOUNTER — Ambulatory Visit (INDEPENDENT_AMBULATORY_CARE_PROVIDER_SITE_OTHER): Payer: BLUE CROSS/BLUE SHIELD | Admitting: Cardiology

## 2017-08-02 VITALS — BP 118/68 | HR 70 | Ht 62.0 in | Wt 135.8 lb

## 2017-08-02 DIAGNOSIS — R7303 Prediabetes: Secondary | ICD-10-CM | POA: Diagnosis not present

## 2017-08-02 DIAGNOSIS — E785 Hyperlipidemia, unspecified: Secondary | ICD-10-CM | POA: Diagnosis not present

## 2017-08-02 DIAGNOSIS — I739 Peripheral vascular disease, unspecified: Secondary | ICD-10-CM | POA: Diagnosis not present

## 2017-08-02 NOTE — Patient Instructions (Signed)
Medication Instructions:  Your physician recommends that you continue on your current medications as directed. Please refer to the Current Medication list given to you today.  Labwork: None  Testing/Procedures: None  Follow-Up: Your physician recommends that you schedule a follow-up appointment in: 6 months  Any Other Special Instructions Will Be Listed Below (If Applicable).     If you need a refill on your cardiac medications before your next appointment, please call your pharmacy.   [facility]  Catherene Kaleta A, RN, BSN  

## 2017-08-02 NOTE — Progress Notes (Signed)
Cardiology Office Note:    Date:  08/02/2017   ID:  Rebekah Villanueva, DOB [birth year], MRN [id number]  PCP:  Raina Mina., MD  Cardiologist:  Jenne Campus, MD    Referring MD: Raina Mina., MD   No chief complaint on file. Doing well denies having any problems  History of Present Illness:    Rebekah Villanueva is a 62 y.o. female with peripheral vascular disease, dyslipidemia.  Overall doing well denies have any chest pain tightness squeezing pressure burning chest.  She required 2 surgeries of her foot everything went well.  She do not join the gym but did not have a chance to go there yet.  We talked about this in length and I told her it would be tremendously beneficial to be able to participate in the  gym activities.  Past Medical History:  Diagnosis Date  . Dental crowns present   . Dyslipidemia   . Mucous cyst of toe 12/2016   right great toe  . Osteopenia 04/2017   T score -1.8 FRAX 9% / 1%  . Peripheral vascular disease [facility])     Past Surgical History:  Procedure Laterality Date  . BREAST BIOPSY Left 2015   benign  . CARPAL TUNNEL RELEASE Right 08/24/2014   Procedure:  RIGHT CARPAL TUNNEL RELEASE;  Surgeon: Daryll Brod, MD;  Location: [place];  Service: Orthopedics;  Laterality: Right;  REGIONAL/FAB  . CARPAL TUNNEL RELEASE Left 06/2015  . CESAREAN SECTION  1996  . CYST EXCISION Right 08/23/2016   Procedure: Right hallux excision of mucous cyst and local soft tissue flap rearrangement;  Surgeon: Wylene Simmer, MD;  Location: [place];  Service: Orthopedics;  Laterality: Right;  . DILATATION & CURETTAGE/HYSTEROSCOPY WITH MYOSURE N/A 10/03/2016   Procedure: [place];  Surgeon: [name], MD;  Location: [place];  Service: Gynecology;  Laterality: N/A;  . GANGLION CYST EXCISION Right 01/10/2017   Procedure: Excision of right hallux mucous cyst;  Surgeon: Wylene Simmer, MD;   Location: [place];  Service: Orthopedics;  Laterality: Right;  . MYOMECTOMY  1994  . PELVIC LAPAROSCOPY  ~ 1992  . TOE SURGERY    . TONSILLECTOMY AND ADENOIDECTOMY  age 13    Current Medications: Current Meds  Medication Sig  . atorvastatin (LIPITOR) 40 MG tablet Take 1 tablet (40 mg total) by mouth daily.  Marland Kitchen estradiol (ESTRACE) 1 MG tablet Take 1 tablet (1 mg total) by mouth daily.  Marland Kitchen ibuprofen (ADVIL,MOTRIN) 800 MG tablet Take 800 mg by mouth every 8 (eight) hours as needed.  . Multiple Vitamin (MULTIVITAMIN) tablet Take 1 tablet by mouth daily.  . Multiple Vitamins-Minerals (HAIR SKIN AND NAILS FORMULA) TABS Take 1 tablet by mouth.     Allergies:   Oxycodone   Social History   Socioeconomic History  . Marital status: Single    Spouse name: Not on file  . Number of children: Not on file  . Years of education: Not on file  . Highest education level: Not on file  Occupational History  . Not on file  Social Needs  . Financial resource strain: Not on file  . Food insecurity:    Worry: Not on file    Inability: Not on file  . Transportation needs:    Medical: Not on file    Non-medical: Not on file  Tobacco Use  . Smoking status: Never Smoker  . Smokeless tobacco:  Never Used  Substance and Sexual Activity  . Alcohol use: Yes    Comment: occasionally   . Drug use: No  . Sexual activity: Yes    Birth control/protection: Post-menopausal    Comment: 1st intercourse 62 yo-More than 5 partners  Lifestyle  . Physical activity:    Days per week: Not on file    Minutes per session: Not on file  . Stress: Not on file  Relationships  . Social connections:    Talks on phone: Not on file    Gets together: Not on file    Attends religious service: Not on file    Active member of club or organization: Not on file    Attends meetings of clubs or organizations: Not on file    Relationship status: Not on file  Other Topics Concern  . Not on file  Social  History Narrative  . Not on file     Family History: The patient's family history includes Diabetes in her mother; Hyperlipidemia in her mother and sister; Hypertension in her mother. ROS:   Please see the history of present illness.    All 14 point review of systems negative except as described per history of present illness  EKGs/Labs/Other Studies Reviewed:      Recent Labs: 10/12/2016: ALT 23; BUN 20; Creatinine, Ser 0.75; Hemoglobin 12.4; Platelets 290; Potassium 4.6; Sodium 144; TSH 0.725  Recent Lipid Panel    Component Value Date/Time   CHOL 180 05/06/2017 0952   TRIG 144 05/06/2017 0952   HDL 66 05/06/2017 0952   CHOLHDL 2.7 05/06/2017 0952   LDLCALC 85 05/06/2017 0952    Physical Exam:    VS:  BP 118/68 (BP Location: Right Arm, Patient Position: Sitting, Cuff Size: Normal)   Pulse 70   Ht 5\' 2"  (1.575 m)   Wt 135 lb 12.8 oz (61.6 kg)   SpO2 96%   BMI 24.84 kg/m     Wt Readings from Last 3 Encounters:  08/02/17 135 lb 12.8 oz (61.6 kg)  02/22/17 136 lb (61.7 kg)  01/10/17 132 lb 6.4 oz (60.1 kg)     GEN:  Well nourished, well developed in no acute distress HEENT: Normal NECK: No JVD; No carotid bruits LYMPHATICS: No lymphadenopathy CARDIAC: RRR, no murmurs, no rubs, no gallops RESPIRATORY:  Clear to auscultation without rales, wheezing or rhonchi  ABDOMEN: Soft, non-tender, non-distended MUSCULOSKELETAL:  No edema; No deformity  SKIN: Warm and dry LOWER EXTREMITIES: no swelling NEUROLOGIC:  Alert and oriented x 3 PSYCHIATRIC:  Normal affect   ASSESSMENT:    1. Dyslipidemia   2. Borderline diabetes   3. Peripheral vascular disease ([place])    PLAN:    In order of problems listed above:  1. Dyslipidemia last fasting lipid profile acceptable she takes 40 mg of Lipitor which I will continue.  Will recheck cholesterol in 6 months 2. Borderline diabetes doing well from that point review but again critically essential of the exercise on the regular  basis she understands and she will try to do it 3. Peripheral vascular disease last carotid ultrasounds did not reveal any critical stenosis.  We will continue present management.  See her back 6 months sooner if she get a problem   Medication Adjustments/Labs and Tests Ordered: Current medicines are reviewed at length with the patient today.  Concerns regarding medicines are outlined above.  No orders of the defined types were placed in this encounter.  Medication changes: No orders of the defined  types were placed in this encounter.   Signed, Park Liter, MD, [facility] 08/02/2017 8:27 AM    [place]

## 2018-01-05 DIAGNOSIS — H6691 Otitis media, unspecified, right ear: Secondary | ICD-10-CM | POA: Diagnosis not present

## 2018-02-28 ENCOUNTER — Encounter: Payer: Self-pay | Admitting: Gynecology

## 2018-02-28 ENCOUNTER — Ambulatory Visit (INDEPENDENT_AMBULATORY_CARE_PROVIDER_SITE_OTHER): Payer: BLUE CROSS/BLUE SHIELD | Admitting: Gynecology

## 2018-02-28 ENCOUNTER — Other Ambulatory Visit: Payer: Self-pay | Admitting: Gynecology

## 2018-02-28 VITALS — BP 124/80 | Ht 62.0 in | Wt 140.0 lb

## 2018-02-28 DIAGNOSIS — M8580 Other specified disorders of bone density and structure, unspecified site: Secondary | ICD-10-CM

## 2018-02-28 DIAGNOSIS — Z01419 Encounter for gynecological examination (general) (routine) without abnormal findings: Secondary | ICD-10-CM

## 2018-02-28 DIAGNOSIS — D251 Intramural leiomyoma of uterus: Secondary | ICD-10-CM

## 2018-02-28 DIAGNOSIS — N952 Postmenopausal atrophic vaginitis: Secondary | ICD-10-CM

## 2018-02-28 DIAGNOSIS — Z79890 Hormone replacement therapy (postmenopausal): Secondary | ICD-10-CM | POA: Diagnosis not present

## 2018-02-28 DIAGNOSIS — D252 Subserosal leiomyoma of uterus: Secondary | ICD-10-CM

## 2018-02-28 MED ORDER — ESTRADIOL 1 MG PO TABS: 1.0000 mg | ORAL_TABLET | Freq: Every day | ORAL | 4 refills | Status: DC

## 2018-02-28 MED ORDER — PROGESTERONE MICRONIZED 100 MG PO CAPS: 100.0000 mg | ORAL_CAPSULE | Freq: Every day | ORAL | 4 refills | Status: DC

## 2018-02-28 NOTE — Patient Instructions (Signed)
Follow-up in 1 year for annual exam, sooner as needed. 

## 2018-02-28 NOTE — Progress Notes (Signed)
    Rebekah Villanueva Jan 13, 1956 [id number]        63 y.o.  G2P1011 for annual gynecologic exam.  Doing well without gynecologic complaints  Past medical history,surgical history, problem list, medications, allergies, family history and social history were all reviewed and documented as reviewed in the EPIC chart.  ROS:  Performed with pertinent positives and negatives included in the history, assessment and plan.   Additional significant findings : None   Exam: Wandra Scot assistant Vitals:   02/28/18 1042  BP: 124/80  Weight: 140 lb (63.5 kg)  Height: 5\' 2"  (1.575 m)   Body mass index is 25.61 kg/m.  General appearance:  Normal affect, orientation and appearance. Skin: Grossly normal HEENT: Without gross lesions.  No cervical or supraclavicular adenopathy. Thyroid normal.  Lungs:  Clear without wheezing, rales or rhonchi Cardiac: RR, without RMG Abdominal:  Soft, nontender, without masses, guarding, rebound, organomegaly or hernia Breasts:  Examined lying and sitting without masses, retractions, discharge or axillary adenopathy. Pelvic:  Ext, BUS, Vagina: Normal with atrophic changes  Cervix: Normal with atrophic changes  Uterus: Generous in size midline mobile nontender.  Adnexa: Without masses or tenderness    Anus and perineum: Normal   Rectovaginal: Normal sphincter tone without palpated masses or tenderness.    Assessment/Plan:  63 y.o. G84P1011 female for annual gynecologic exam.   1. Postmenopausal/HRT.  Continues on estradiol 1 mg and Prometrium 100 mg nightly.  No bleeding or other issues.  We again discussed the issue of HRT to include risks versus benefits and various studies.  When to wean issue also discussed.  Risks to include thrombosis such as stroke heart attack DVT and breast cancer issues reviewed versus benefits to include symptom relief as well as possible cardiovascular and bone health when started early.  At this point the patient wants to continue and I  refilled her x1 year. 2. Osteopenia.  DEXA 04/2017 T score -1.8 FRAX 9% / 1%.  Plan repeat DEXA next year at 2-year interval. 3. Leiomyoma.  Uterus generous in size stable on serial exams.  Asymptomatic to the patient.  Continue to follow with annual exams. 4. Pap smear/HPV 11/2014.  No Pap smear done today.  No history of significant abnormal Pap smears.  Plan repeat Pap smear/HPV at 5-year interval per current screening guidelines. 5. Colonoscopy 2016.  The patient is going to call and check when they want to repeat this. 6. Mammography 05/2017.  Repeat this coming May.  Breast exam normal today. 7. Health maintenance.  No routine lab work done as patient does this elsewhere.  Follow-up 1 year, sooner as needed.   [name] MD, 11:11 AM 02/28/2018

## 2018-03-01 ENCOUNTER — Other Ambulatory Visit: Payer: Self-pay | Admitting: Cardiology

## 2018-04-28 ENCOUNTER — Other Ambulatory Visit: Payer: Self-pay | Admitting: Cardiology

## 2018-04-28 ENCOUNTER — Telehealth: Payer: Self-pay

## 2018-04-28 MED ORDER — ATORVASTATIN CALCIUM 40 MG PO TABS: 40.0000 mg | ORAL_TABLET | Freq: Every day | ORAL | 1 refills | Status: DC

## 2018-04-28 NOTE — Telephone Encounter (Signed)
Sent in 90 but needs OV before more.

## 2018-04-28 NOTE — Telephone Encounter (Signed)
Left voicemail to contact the office about switching to a virtual office visit and getting consent.

## 2018-04-28 NOTE — Telephone Encounter (Signed)
°*  STAT* If patient is at the pharmacy, call can be transferred to refill team.   1. Which medications need to be refilled? (please list name of each medication and dose if known) Atorvastatin 40mg  tablet once daily  2. Which pharmacy/location (including street and city if local pharmacy) is medication to be sent to?[place] in [place]  3. Do they need a 30 day or 90 day supply? [place]

## 2018-05-02 ENCOUNTER — Telehealth: Payer: BLUE CROSS/BLUE SHIELD | Admitting: Cardiology

## 2018-06-06 ENCOUNTER — Telehealth: Payer: BLUE CROSS/BLUE SHIELD | Admitting: Cardiology

## 2018-06-27 ENCOUNTER — Other Ambulatory Visit: Payer: Self-pay | Admitting: Gynecology

## 2018-06-27 DIAGNOSIS — Z1231 Encounter for screening mammogram for malignant neoplasm of breast: Secondary | ICD-10-CM

## 2018-07-11 ENCOUNTER — Telehealth: Payer: BLUE CROSS/BLUE SHIELD | Admitting: Cardiology

## 2018-07-11 ENCOUNTER — Other Ambulatory Visit: Payer: Self-pay

## 2018-07-11 DIAGNOSIS — W540XXA Bitten by dog, initial encounter: Secondary | ICD-10-CM | POA: Diagnosis not present

## 2018-07-11 DIAGNOSIS — S80212A Abrasion, left knee, initial encounter: Secondary | ICD-10-CM | POA: Diagnosis not present

## 2018-07-11 DIAGNOSIS — S60417A Abrasion of left little finger, initial encounter: Secondary | ICD-10-CM | POA: Diagnosis not present

## 2018-07-11 DIAGNOSIS — Z23 Encounter for immunization: Secondary | ICD-10-CM | POA: Diagnosis not present

## 2018-07-11 DIAGNOSIS — S41132A Puncture wound without foreign body of left upper arm, initial encounter: Secondary | ICD-10-CM | POA: Diagnosis not present

## 2018-08-15 ENCOUNTER — Ambulatory Visit: Payer: BLUE CROSS/BLUE SHIELD

## 2018-08-29 ENCOUNTER — Other Ambulatory Visit: Payer: Self-pay | Admitting: Cardiology

## 2018-09-03 ENCOUNTER — Telehealth: Payer: Self-pay | Admitting: *Deleted

## 2018-09-03 ENCOUNTER — Telehealth: Payer: Self-pay | Admitting: Cardiology

## 2018-09-03 MED ORDER — ATORVASTATIN CALCIUM 40 MG PO TABS: 40.0000 mg | ORAL_TABLET | Freq: Every day | ORAL | 0 refills | Status: DC

## 2018-09-03 NOTE — Telephone Encounter (Signed)
error 

## 2018-09-03 NOTE — Telephone Encounter (Signed)
Patient is scheduled for Dr Raliegh Ip for follow up in September and needs her Lipitor called into the pharmacy she uses.Marland Kitchen

## 2018-09-03 NOTE — Telephone Encounter (Signed)
Refill sent.

## 2018-09-26 ENCOUNTER — Ambulatory Visit: Payer: BLUE CROSS/BLUE SHIELD

## 2018-10-02 ENCOUNTER — Ambulatory Visit: Payer: BLUE CROSS/BLUE SHIELD | Admitting: Cardiology

## 2018-10-03 ENCOUNTER — Other Ambulatory Visit: Payer: Self-pay

## 2018-10-03 ENCOUNTER — Ambulatory Visit
Admission: RE | Admit: 2018-10-03 | Discharge: 2018-10-03 | Disposition: A | Discharge status: Home / Self Care | Payer: BC Managed Care – PPO | Source: Ambulatory Visit | Attending: Gynecology | Admitting: Gynecology

## 2018-10-03 ENCOUNTER — Ambulatory Visit: Payer: BLUE CROSS/BLUE SHIELD

## 2018-10-03 DIAGNOSIS — Z1231 Encounter for screening mammogram for malignant neoplasm of breast: Secondary | ICD-10-CM | POA: Diagnosis not present

## 2018-10-06 ENCOUNTER — Other Ambulatory Visit: Payer: Self-pay | Admitting: Gynecology

## 2018-10-06 DIAGNOSIS — R928 Other abnormal and inconclusive findings on diagnostic imaging of breast: Secondary | ICD-10-CM

## 2018-10-10 ENCOUNTER — Ambulatory Visit: Payer: BC Managed Care – PPO

## 2018-10-10 ENCOUNTER — Other Ambulatory Visit: Payer: Self-pay

## 2018-10-10 ENCOUNTER — Ambulatory Visit
Admission: RE | Admit: 2018-10-10 | Discharge: 2018-10-10 | Disposition: A | Discharge status: Home / Self Care | Payer: BC Managed Care – PPO | Source: Ambulatory Visit | Attending: Gynecology | Admitting: Gynecology

## 2018-10-10 DIAGNOSIS — R922 Inconclusive mammogram: Secondary | ICD-10-CM | POA: Diagnosis not present

## 2018-10-10 DIAGNOSIS — R928 Other abnormal and inconclusive findings on diagnostic imaging of breast: Secondary | ICD-10-CM

## 2018-10-21 ENCOUNTER — Encounter: Payer: Self-pay | Admitting: Gynecology

## 2018-11-03 DIAGNOSIS — M722 Plantar fascial fibromatosis: Secondary | ICD-10-CM | POA: Diagnosis not present

## 2018-11-03 DIAGNOSIS — M6701 Short Achilles tendon (acquired), right ankle: Secondary | ICD-10-CM | POA: Diagnosis not present

## 2018-11-14 ENCOUNTER — Ambulatory Visit (INDEPENDENT_AMBULATORY_CARE_PROVIDER_SITE_OTHER): Payer: BC Managed Care – PPO | Admitting: Cardiology

## 2018-11-14 ENCOUNTER — Other Ambulatory Visit: Payer: Self-pay

## 2018-11-14 ENCOUNTER — Encounter: Payer: Self-pay | Admitting: Cardiology

## 2018-11-14 VITALS — BP 110/70 | HR 72 | Ht 62.0 in | Wt 135.6 lb

## 2018-11-14 DIAGNOSIS — R7303 Prediabetes: Secondary | ICD-10-CM

## 2018-11-14 DIAGNOSIS — I739 Peripheral vascular disease, unspecified: Secondary | ICD-10-CM | POA: Diagnosis not present

## 2018-11-14 DIAGNOSIS — E785 Hyperlipidemia, unspecified: Secondary | ICD-10-CM

## 2018-11-14 NOTE — Patient Instructions (Signed)
Medication Instructions:  Your physician recommends that you continue on your current medications as directed. Please refer to the Current Medication list given to you today.  *If you need a refill on your cardiac medications before your next appointment, please call your pharmacy*  Lab Work: Your physician recommends that you return for lab work today: Lipids, cbc, tsh, cmp (FASTING)   If you have labs (blood work) drawn today and your tests are completely normal, you will receive your results only by: Marland Kitchen MyChart Message (if you have MyChart) OR . A paper copy in the mail If you have any lab test that is abnormal or we need to change your treatment, we will call you to review the results.  Testing/Procedures: None.   Follow-Up: At [facility], you and your health needs are our priority.  As part of our continuing mission to provide you with exceptional heart care, we have created designated Provider Care Teams.  These Care Teams include your primary Cardiologist (physician) and Advanced Practice Providers (APPs -  Physician Assistants and Nurse Practitioners) who all work together to provide you with the care you need, when you need it.  Your next appointment:   12 months  The format for your next appointment:   In Person  Provider:   Jenne Campus, MD  Other Instructions

## 2018-11-14 NOTE — Progress Notes (Signed)
Cardiology Office Note:    Date:  11/14/2018   ID:  Rebekah Villanueva, Rebekah Villanueva 08/28/1955, MRN [id number]  PCP:  Raina Mina., MD  Cardiologist:  Jenne Campus, MD    Referring MD: Raina Mina., MD   Chief Complaint  Patient presents with  . Follow-up  Doing well  History of Present Illness:    Rebekah Villanueva is a 63 y.o. female comes today to my office for follow-up she did have some history of peripheral vascular disease but last cardiac ultrasound did not show significant stenosis has been taking statin which I recommended to continue overall doing well she did have some foot surgery because of some cyst things went well and she started doing better right now in terms of exercises.   Past Medical History:  Diagnosis Date  . Dental crowns present   . Dyslipidemia   . Mucous cyst of toe 12/2016   right great toe  . Osteopenia 04/2017   T score -1.8 FRAX 9% / 1%  . Peripheral vascular disease [facility])     Past Surgical History:  Procedure Laterality Date  . BREAST BIOPSY Left 2015   benign  . CARPAL TUNNEL RELEASE Right 08/24/2014   Procedure:  RIGHT CARPAL TUNNEL RELEASE;  Surgeon: Daryll Brod, MD;  Location: [place];  Service: Orthopedics;  Laterality: Right;  REGIONAL/FAB  . CARPAL TUNNEL RELEASE Left 06/2015  . CESAREAN SECTION  1996  . CYST EXCISION Right 08/23/2016   Procedure: Right hallux excision of mucous cyst and local soft tissue flap rearrangement;  Surgeon: Wylene Simmer, MD;  Location: [place];  Service: Orthopedics;  Laterality: Right;  . DILATATION & CURETTAGE/HYSTEROSCOPY WITH MYOSURE N/A 10/03/2016   Procedure: [place];  Surgeon: [name], MD;  Location: [place];  Service: Gynecology;  Laterality: N/A;  . GANGLION CYST EXCISION Right 01/10/2017   Procedure: Excision of right hallux mucous cyst;  Surgeon: Wylene Simmer, MD;  Location: [place];  Service: Orthopedics;  Laterality: Right;  . MYOMECTOMY  1994  . PELVIC LAPAROSCOPY  ~ 1992  . TOE SURGERY    . TONSILLECTOMY AND ADENOIDECTOMY  age 38    Current Medications: Current Meds  Medication Sig  . atorvastatin (LIPITOR) 40 MG tablet Take 1 tablet (40 mg total) by mouth daily.  Marland Kitchen estradiol (ESTRACE) 1 MG tablet Take 1 tablet (1 mg total) by mouth daily.  Marland Kitchen ibuprofen (ADVIL,MOTRIN) 800 MG tablet Take 800 mg by mouth every 8 (eight) hours as needed.  . Multiple Vitamin (MULTIVITAMIN) tablet Take 1 tablet by mouth daily.  . Multiple Vitamins-Minerals (HAIR SKIN AND NAILS FORMULA) TABS Take 1 tablet by mouth.  . naproxen sodium (ALEVE) 220 MG tablet Take 220 mg by mouth 2 (two) times daily as needed.  . progesterone (PROMETRIUM) 100 MG capsule Take 1 capsule (100 mg total) by mouth at bedtime.     Allergies:   Oxycodone   Social History   Socioeconomic History  . Marital status: Single    Spouse name: Not on file  . Number of children: Not on file  . Years of education: Not on file  . Highest education level: Not on file  Occupational History  . Not on file  Social Needs  . Financial resource strain: Not on file  . Food insecurity    Worry: Not on file    Inability: Not on file  . Transportation needs  Medical: Not on file    Non-medical: Not on file  Tobacco Use  . Smoking status: Never Smoker  . Smokeless tobacco: Never Used  Substance and Sexual Activity  . Alcohol use: Yes    Comment: occasionally   . Drug use: No  . Sexual activity: Yes    Birth control/protection: Post-menopausal    Comment: 1st intercourse 63 yo-More than 5 partners  Lifestyle  . Physical activity    Days per week: Not on file    Minutes per session: Not on file  . Stress: Not on file  Relationships  . Social Herbalist on phone: Not on file    Gets together: Not on file    Attends religious service: Not on file    Active member of club or  organization: Not on file    Attends meetings of clubs or organizations: Not on file    Relationship status: Not on file  Other Topics Concern  . Not on file  Social History Narrative  . Not on file     Family History: The patient's family history includes Diabetes in her mother; Hyperlipidemia in her mother and sister; Hypertension in her mother. ROS:   Please see the history of present illness.    All 14 point review of systems negative except as described per history of present illness  EKGs/Labs/Other Studies Reviewed:      Recent Labs: No results found for requested labs within last 8760 hours.  Recent Lipid Panel    Component Value Date/Time   CHOL 180 05/06/2017 0952   TRIG 144 05/06/2017 0952   HDL 66 05/06/2017 0952   CHOLHDL 2.7 05/06/2017 0952   LDLCALC 85 05/06/2017 0952    Physical Exam:    VS:  BP 110/70   Pulse 72   Ht 5\' 2"  (1.575 m)   Wt 135 lb 9.6 oz (61.5 kg)   SpO2 98%   BMI 24.80 kg/m     Wt Readings from Last 3 Encounters:  11/14/18 135 lb 9.6 oz (61.5 kg)  02/28/18 140 lb (63.5 kg)  08/02/17 135 lb 12.8 oz (61.6 kg)     GEN:  Well nourished, well developed in no acute distress HEENT: Normal NECK: No JVD; No carotid bruits LYMPHATICS: No lymphadenopathy CARDIAC: RRR, no murmurs, no rubs, no gallops RESPIRATORY:  Clear to auscultation without rales, wheezing or rhonchi  ABDOMEN: Soft, non-tender, non-distended MUSCULOSKELETAL:  No edema; No deformity  SKIN: Warm and dry LOWER EXTREMITIES: no swelling NEUROLOGIC:  Alert and oriented x 3 PSYCHIATRIC:  Normal affect   ASSESSMENT:    1. Borderline diabetes   2. Dyslipidemia   3. Peripheral vascular disease ([place])    PLAN:    In order of problems listed above:  1. Borderline diabetes we talked about need to exercise which is trying to do she is was kind of afraid to go to gym because of coronavirus situation but now she is more open towards doing some more exercises outdoor.  We  will continue 2. Dyslipidemia fasting profile will be done 3. Peripheral vascular disease last carotid ultrasound did not show any significant stenosis 4. I will ask her to have CBC complete metabolic panel as well as TSH since she does not have primary care physician.  I told her she need to get primary care physician she is being followed by her gynecologist but I will do as per her request dose of routine blood tests.   Medication Adjustments/Labs  and Tests Ordered: Current medicines are reviewed at length with the patient today.  Concerns regarding medicines are outlined above.  No orders of the defined types were placed in this encounter.  Medication changes: No orders of the defined types were placed in this encounter.   Signed, Park Liter, MD, [facility] 11/14/2018 2:38 PM    [place]

## 2018-12-05 ENCOUNTER — Ambulatory Visit: Payer: BC Managed Care – PPO | Admitting: Cardiology

## 2018-12-12 ENCOUNTER — Other Ambulatory Visit: Payer: Self-pay | Admitting: Cardiology

## 2018-12-12 NOTE — Telephone Encounter (Signed)
Atorvastatin refill sent to [facility] in [place] 

## 2019-03-04 ENCOUNTER — Other Ambulatory Visit: Payer: Self-pay

## 2019-03-06 ENCOUNTER — Encounter: Payer: Self-pay | Admitting: Obstetrics and Gynecology

## 2019-03-06 ENCOUNTER — Other Ambulatory Visit: Payer: Self-pay

## 2019-03-06 ENCOUNTER — Ambulatory Visit (INDEPENDENT_AMBULATORY_CARE_PROVIDER_SITE_OTHER): Payer: BC Managed Care – PPO | Admitting: Obstetrics and Gynecology

## 2019-03-06 VITALS — BP 116/74 | Ht 62.0 in | Wt 127.0 lb

## 2019-03-06 DIAGNOSIS — F418 Other specified anxiety disorders: Secondary | ICD-10-CM

## 2019-03-06 DIAGNOSIS — Z79890 Hormone replacement therapy (postmenopausal): Secondary | ICD-10-CM

## 2019-03-06 DIAGNOSIS — Z01419 Encounter for gynecological examination (general) (routine) without abnormal findings: Secondary | ICD-10-CM | POA: Diagnosis not present

## 2019-03-06 DIAGNOSIS — M8580 Other specified disorders of bone density and structure, unspecified site: Secondary | ICD-10-CM | POA: Diagnosis not present

## 2019-03-06 MED ORDER — SERTRALINE HCL 50 MG PO TABS: 50.0000 mg | ORAL_TABLET | Freq: Every day | ORAL | 11 refills | Status: DC

## 2019-03-06 NOTE — Patient Instructions (Signed)
We will plan to repeat the DEXA/bone density scan sometime this year in 2021.  Continue weight bearing exercise, and vitamin D/calcium intake. Try the Zoloft for mood, let us know if experiencing any severe side effects that we discussed Next Pap smear will be in 2022

## 2019-03-06 NOTE — Progress Notes (Signed)
Rebekah Villanueva July 22, 1955 [id number]  SUBJECTIVE:  64 y.o. G2P1011 female for annual routine gynecologic exam. She has no gynecologic concerns.  She has not yet tried to wean down on doses of the Estrace, but she has been having a hard time lately as her daughter has been having a hard time lately with the surrounding the past she wants to take with her education, and also the state of matters in the world.  She has been feeling quite depressed and also anxious.  She inquires if there are any treatments available that she could try for this.  Says no prior history of antidepressant medication use.  Current Outpatient Medications  Medication Sig Dispense Refill  . atorvastatin (LIPITOR) 40 MG tablet Take 1 tablet (40 mg total) by mouth daily. 90 tablet 3  . estradiol (ESTRACE) 1 MG tablet Take 1 tablet (1 mg total) by mouth daily. 90 tablet 4  . ibuprofen (ADVIL,MOTRIN) 800 MG tablet Take 800 mg by mouth every 8 (eight) hours as needed.    . Multiple Vitamin (MULTIVITAMIN) tablet Take 1 tablet by mouth daily.    . Multiple Vitamins-Minerals (HAIR SKIN AND NAILS FORMULA) TABS Take 1 tablet by mouth.    . naproxen sodium (ALEVE) 220 MG tablet Take 220 mg by mouth 2 (two) times daily as needed.    . progesterone (PROMETRIUM) 100 MG capsule Take 1 capsule (100 mg total) by mouth at bedtime. 90 capsule 4   No current facility-administered medications for this visit.   Allergies: Oxycodone  No LMP recorded. Patient is postmenopausal.  Past medical history,surgical history, problem list, medications, allergies, family history and social history were all reviewed and documented as reviewed in the EPIC chart.  ROS:  Feeling well. No dyspnea or chest pain on exertion.  No abdominal pain, change in bowel habits, black or bloody stools.  No urinary tract symptoms. GYN ROS: no abnormal bleeding, pelvic pain or discharge, no breast pain or new or enlarging lumps on self exam. No neurological  complaints. +depression and anxiety symptoms    OBJECTIVE:  Ht 5\' 2"  (1.575 m)   Wt 127 lb (57.6 kg)   BMI 23.23 kg/m  The patient appears well, alert, oriented x 3, in no distress.  Tearful at times during the discussion today.  Normal affect and emotional range. ENT normal.  Neck supple. No cervical or supraclavicular adenopathy or thyromegaly.  Lungs are clear, good air entry, no wheezes, rhonchi or rales. S1 and S2 normal, no murmurs, regular rate and rhythm.  Abdomen soft without tenderness, guarding, mass or organomegaly.  Neurological is normal, no focal findings.  BREAST EXAM: breasts appear normal, no suspicious masses, no skin or nipple changes or axillary nodes  PELVIC EXAM: VULVA: normal appearing vulva with no masses, tenderness or lesions, VAGINA: normal appearing vagina with normal color and discharge, no lesions, CERVIX: normal appearing cervix without discharge or lesions, UTERUS: uterus is normal size, shape, consistency and nontender, ADNEXA: normal adnexa in size, nontender and no masses, RECTAL: normal rectal, no masses  Chaperone: [name] present during the examination  ASSESSMENT:  64 y.o. G2P1011 here for annual gynecologic exam  PLAN:    1.  Postmenopausal/HRT.  She is still taking estradiol 1 mg daily and Prometrium 100 mg nightly.  She does not have any vaginal bleeding or abnormal symptoms.  We again reviewed the risks of HRT to include thrombosis including heart attack, stroke, DVT, PE, and the increased risk of breast cancer and uterine  cancer.  Positive effects for bone health are noted.  Encouraged her to try to wean when she feels her mood is improved, I would not recommend doing that at the moment given she is feeling more depressed recently.  When ready, I recommended trying to cut the estradiol in half for 0.5 mg dose daily, and then see how it goes from there. 2. Pap smear 11/2014. Not repeated today. No prior history of abnormal Pap smears. Next  Pap smear due 2022 following the current screening guidelines calling for the 5-year interval. 3. Mammogram 09/2018. Will continue with annual mammography. Breast exam normal today. 4. Colonoscopy 2016. Recommended that she continue per the prescribed interval.  5. Osteopenia.  DEXA 04/2017 T score -1.8 FRAX 9% / 1%.  Plan repeat DEXA next year at 2-year interval which would be this year, she will aim for sometime this summer. 6. Leiomyoma.  Uterus feels normal on exam.  Asymptomatic to the patient.  Continue to follow with annual exams. 7.  Depression and anxiety.  We discussed the symptoms she is having in some of her triggers.  She has been struggling more lately and is interested in trialing medication therapy.  We talked about Zoloft 50 mg daily and a prescription was given.  Common side effects include lethargy, insomnia, drowsiness, decreased libido, GI symptoms, headaches, etc. are all reviewed.  Increased risk of suicidal thoughts is a risk and she needs to seek evaluation and help immediately if this were to occur.  Follow-up as needed for this concern. 8. Health maintenance.  No lab work as she has this completed with her primary care provider.    15 additional minutes were spent discussing depression and anxiety and treatment.  Return annually or sooner, prn.  Joseph Pierini MD  03/06/19

## 2019-04-03 ENCOUNTER — Telehealth: Payer: Self-pay | Admitting: *Deleted

## 2019-04-03 MED ORDER — SERTRALINE HCL 100 MG PO TABS: 100.0000 mg | ORAL_TABLET | Freq: Every day | ORAL | 10 refills | Status: DC

## 2019-04-03 NOTE — Telephone Encounter (Signed)
Patient was prescribed Zoloft 50 mg tablet at [facility] on 03/06/19. Patient said the medication has helped a "tiny bit" but still having crying spells and feeling emotional. She asked if an increase could be given? Please advise

## 2019-04-03 NOTE — Telephone Encounter (Signed)
Let's try 100 mg Zoloft daily. Thanks.

## 2019-04-03 NOTE — Telephone Encounter (Signed)
Spoke with patient and advised her Dr. Allena Napoleon recommended trying Zoloft 100 mg daily. She agreed. Rx sent.

## 2019-05-26 ENCOUNTER — Telehealth: Payer: Self-pay | Admitting: *Deleted

## 2019-05-26 MED ORDER — PROGESTERONE MICRONIZED 100 MG PO CAPS: 100.0000 mg | ORAL_CAPSULE | Freq: Every evening | ORAL | 3 refills | Status: DC

## 2019-05-26 MED ORDER — ESTRADIOL 1 MG PO TABS: 1.0000 mg | ORAL_TABLET | Freq: Every day | ORAL | 3 refills | Status: DC

## 2019-05-26 NOTE — Telephone Encounter (Signed)
Patient called requesting refill on estradiol 1 mg tablet and progesterone 100mg  capsule. I asked patient has she tried cutting the estradiol 1 mg tablet in half for the 0.5 mg dose as recommended on 03/06/19 office visit. Patient said she did try to take 0.5 estradiol dose however she started to have symptoms so she started back on estradiol 1 mg dose again. Rx sent with refills until annual exam Feb 2022

## 2019-12-10 DIAGNOSIS — Z20828 Contact with and (suspected) exposure to other viral communicable diseases: Secondary | ICD-10-CM | POA: Diagnosis not present

## 2020-03-18 ENCOUNTER — Encounter: Payer: Self-pay | Admitting: Obstetrics and Gynecology

## 2020-03-18 ENCOUNTER — Other Ambulatory Visit: Payer: Self-pay

## 2020-03-18 ENCOUNTER — Ambulatory Visit (INDEPENDENT_AMBULATORY_CARE_PROVIDER_SITE_OTHER): Payer: BC Managed Care – PPO | Admitting: Obstetrics and Gynecology

## 2020-03-18 VITALS — BP 126/80

## 2020-03-18 DIAGNOSIS — M8580 Other specified disorders of bone density and structure, unspecified site: Secondary | ICD-10-CM

## 2020-03-18 DIAGNOSIS — Z124 Encounter for screening for malignant neoplasm of cervix: Secondary | ICD-10-CM | POA: Diagnosis not present

## 2020-03-18 DIAGNOSIS — Z01419 Encounter for gynecological examination (general) (routine) without abnormal findings: Secondary | ICD-10-CM

## 2020-03-18 DIAGNOSIS — Z79890 Hormone replacement therapy (postmenopausal): Secondary | ICD-10-CM

## 2020-03-18 MED ORDER — ESTRADIOL 1 MG PO TABS: 1.0000 mg | ORAL_TABLET | Freq: Every day | ORAL | 3 refills | Status: AC

## 2020-03-18 MED ORDER — PROGESTERONE MICRONIZED 100 MG PO CAPS: 100.0000 mg | ORAL_CAPSULE | Freq: Every evening | ORAL | 3 refills | Status: AC

## 2020-03-18 NOTE — Progress Notes (Signed)
Rebekah Villanueva 1955/11/26 [id number]  SUBJECTIVE:  65 y.o. G48P1011 female here for annual routine gynecologic exam. She has no gynecologic concerns.  Mood wise doing much better this year.  Current Outpatient Medications  Medication Sig Dispense Refill  . atorvastatin (LIPITOR) 40 MG tablet Take 1 tablet (40 mg total) by mouth daily. 90 tablet 3  . estradiol (ESTRACE) 1 MG tablet Take 1 tablet (1 mg total) by mouth daily. 90 tablet 3  . ibuprofen (ADVIL,MOTRIN) 800 MG tablet Take 800 mg by mouth every 8 (eight) hours as needed.    . Multiple Vitamin (MULTIVITAMIN) tablet Take 1 tablet by mouth daily.    . Multiple Vitamins-Minerals (HAIR SKIN AND NAILS FORMULA) TABS Take 1 tablet by mouth.    . naproxen sodium (ALEVE) 220 MG tablet Take 220 mg by mouth 2 (two) times daily as needed.    . progesterone (PROMETRIUM) 100 MG capsule Take 1 capsule (100 mg total) by mouth at bedtime. 90 capsule 3   No current facility-administered medications for this visit.   Allergies: Oxycodone  No LMP recorded. Patient is postmenopausal.  Past medical history,surgical history, problem list, medications, allergies, family history and social history were all reviewed and documented as reviewed in the EPIC chart.  ROS: Pertinent positives and negatives as reviewed in HPI    OBJECTIVE:  BP 126/80 (BP Location: Right Arm, Patient Position: Sitting, Cuff Size: Normal)  The patient appears well, alert, oriented x 3, in no distress.  Tearful at times during the discussion today.  Normal affect and emotional range. ENT normal.  Neck supple. No cervical or supraclavicular adenopathy or thyromegaly.  Lungs are clear, good air entry, no wheezes, rhonchi or rales. S1 and S2 normal, no murmurs, regular rate and rhythm.  Abdomen soft without tenderness, guarding, mass or organomegaly.  Neurological is normal, no focal findings.  BREAST EXAM: breasts appear normal, no suspicious masses, no skin or nipple  changes or axillary nodes  PELVIC EXAM: VULVA: normal appearing vulva with atrophic change, no masses, tenderness or lesions, VAGINA: normal appearing vagina with atrophic change, normal color and discharge, no lesions, CERVIX: normal appearing cervix without discharge or lesions, UTERUS: Generous sized uterus consistent with previous exams, mildly tender, ADNEXA: normal adnexa in size, nontender and no masses, RECTAL: normal rectal, no masses  Chaperone: Wandra Scot Bonham present during the examination  ASSESSMENT:  65 y.o. G2P1011 here for annual gynecologic exam  PLAN:   1.  Postmenopausal/HRT.  She is still taking estradiol 1 mg daily and Prometrium 100 mg nightly.  She does not have any vaginal bleeding or abnormal symptoms.  We again reviewed the risks of HRT to include thrombosis including heart attack, stroke, DVT, PE, and the increased risk of breast cancer and uterine cancer.  Positive effects for bone health are noted.  I again encouraged her to try to wean when she feels ready.  When ready, I recommended trying to cut the estradiol in half for 0.5 mg dose daily, and then see how it goes from there. 2. Pap smear 11/2014. Pap smear repeated today. No prior history of abnormal Pap smears.  3. Mammogram 09/2018. Will continue with annual mammography and plans to schedule soon. Breast exam normal today. 4. Colonoscopy 2016. Recommended that she continue per the prescribed interval.  5. Osteopenia.  DEXA 04/2017 T score -1.8 FRAX 9% / 1%.  Plan repeat DEXA this year, test is ordered and she will schedule. 6. Leiomyoma.  Uterus feels stable on exam  from previous examinations.  Asymptomatic to the patient.  Continue to follow with annual exams.  7. Depression and anxiety.  She discontinued the sertraline that we started last year she is overall been doing much better. She will continue to monitor any worsening of symptoms. 8. Health maintenance.  No lab work as she has this completed elsewhere.     Return annually or sooner, prn.  Joseph Pierini MD  03/18/20

## 2020-03-22 LAB — PAP IG W/ RFLX HPV ASCU

## 2020-04-29 ENCOUNTER — Other Ambulatory Visit: Payer: Self-pay | Admitting: Internal Medicine

## 2020-04-29 DIAGNOSIS — Z1231 Encounter for screening mammogram for malignant neoplasm of breast: Secondary | ICD-10-CM

## 2020-05-30 DIAGNOSIS — E782 Mixed hyperlipidemia: Secondary | ICD-10-CM | POA: Diagnosis not present

## 2020-05-30 DIAGNOSIS — I739 Peripheral vascular disease, unspecified: Secondary | ICD-10-CM | POA: Diagnosis not present

## 2020-05-30 DIAGNOSIS — Z79890 Hormone replacement therapy (postmenopausal): Secondary | ICD-10-CM | POA: Diagnosis not present

## 2020-05-30 DIAGNOSIS — R7303 Prediabetes: Secondary | ICD-10-CM | POA: Diagnosis not present

## 2020-06-24 ENCOUNTER — Other Ambulatory Visit: Payer: Self-pay

## 2020-06-24 ENCOUNTER — Ambulatory Visit
Admission: RE | Admit: 2020-06-24 | Discharge: 2020-06-24 | Disposition: A | Discharge status: Home / Self Care | Payer: BC Managed Care – PPO | Source: Ambulatory Visit | Attending: Internal Medicine | Admitting: Internal Medicine

## 2020-06-24 DIAGNOSIS — Z1231 Encounter for screening mammogram for malignant neoplasm of breast: Secondary | ICD-10-CM | POA: Diagnosis not present

## 2021-05-15 ENCOUNTER — Other Ambulatory Visit: Payer: Self-pay

## 2021-11-22 IMAGING — MG MM DIGITAL SCREENING BILAT W/ TOMO AND CAD
8 series · 8 of 24 positions shown · non-contrast
Comparison: Previous exam(s).

CLINICAL DATA: Screening.

EXAM:
DIGITAL SCREENING BILATERAL MAMMOGRAM WITH TOMOSYNTHESIS AND CAD
TECHNIQUE: Bilateral screening digital craniocaudal and mediolateral oblique
mammograms were obtained. Bilateral screening digital breast
tomosynthesis was performed. The images were evaluated with
computer-aided detection.

[R MLO synth-2D]
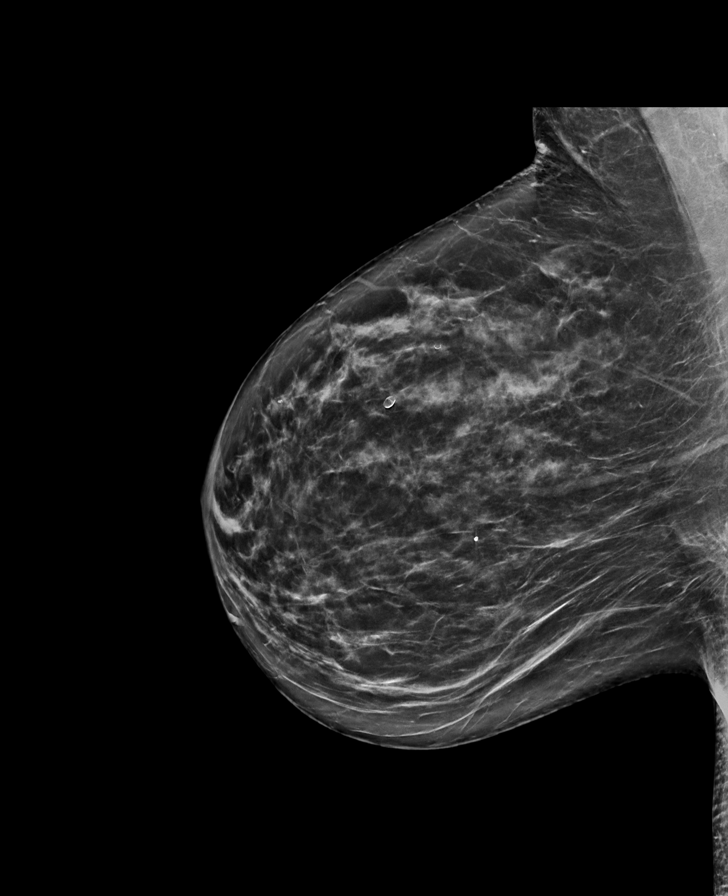

[L CC synth-2D]
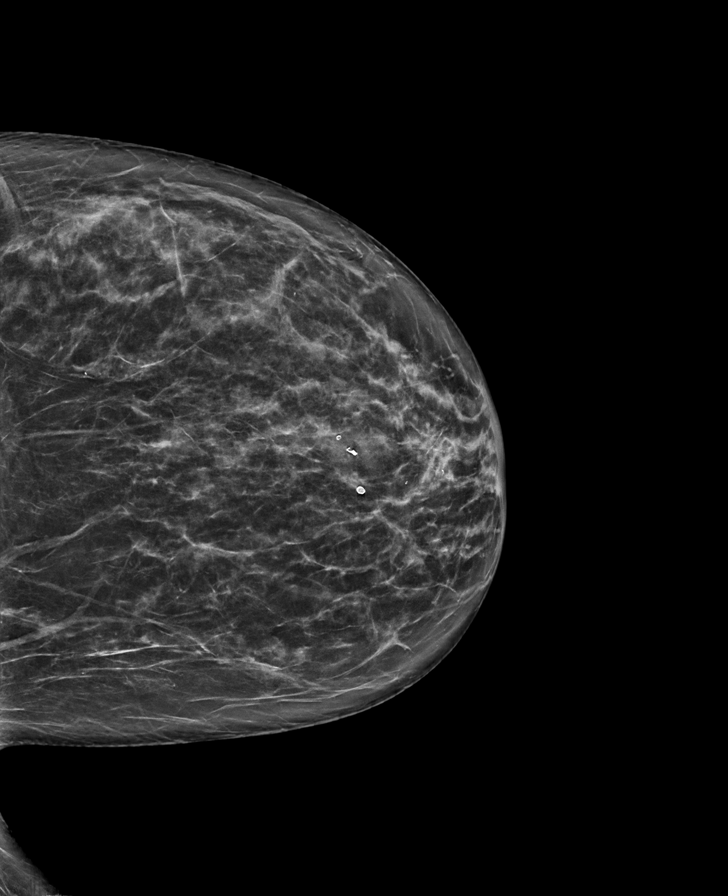

[R CC synth-2D]
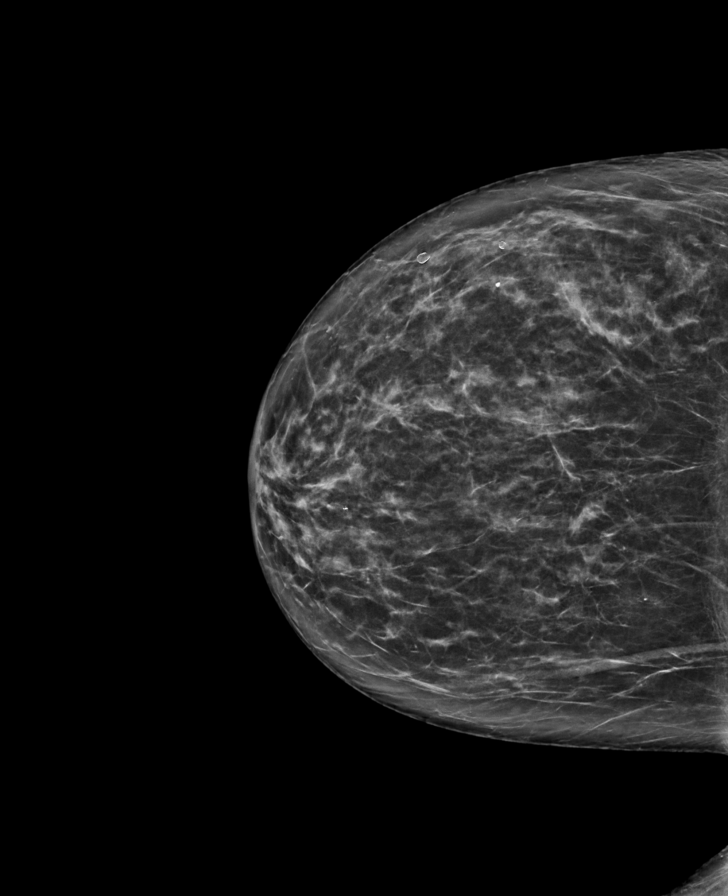

[L MLO synth-2D]
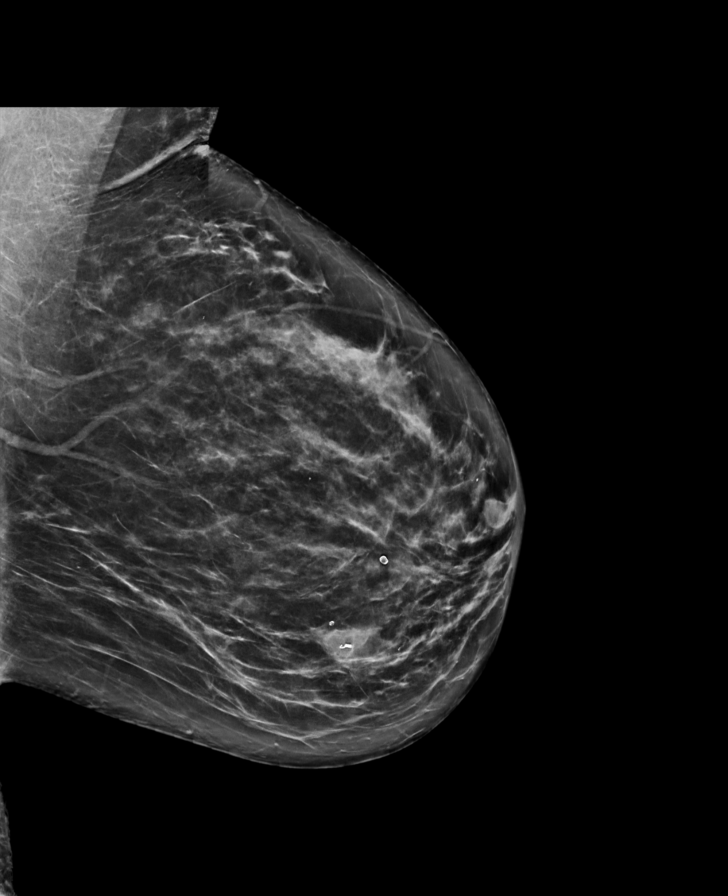

[R MLO tomo · tomo slice 41/80.0]
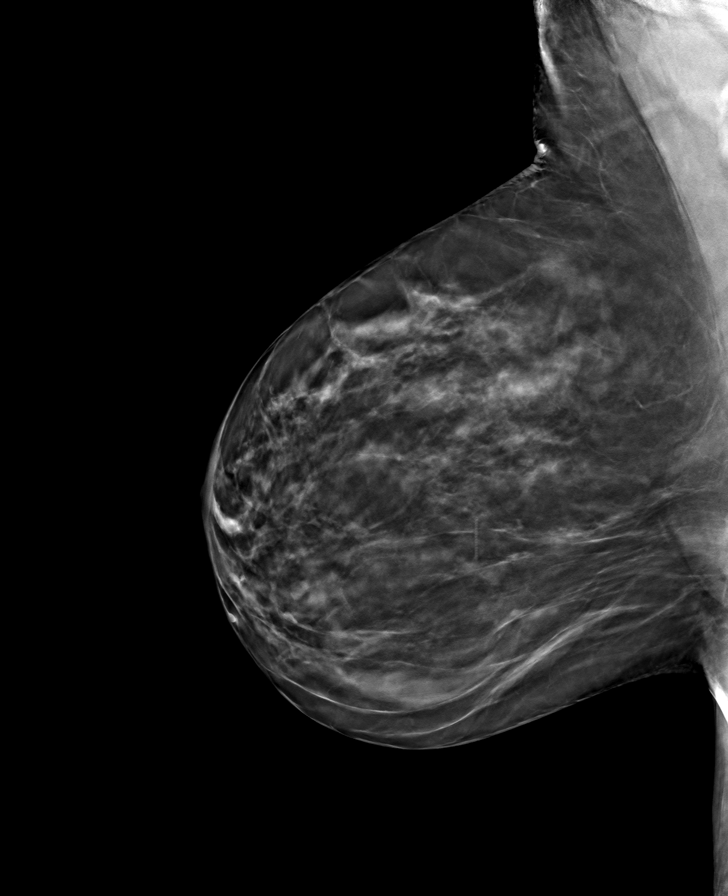

[L CC tomo · tomo slice 37/72.0]
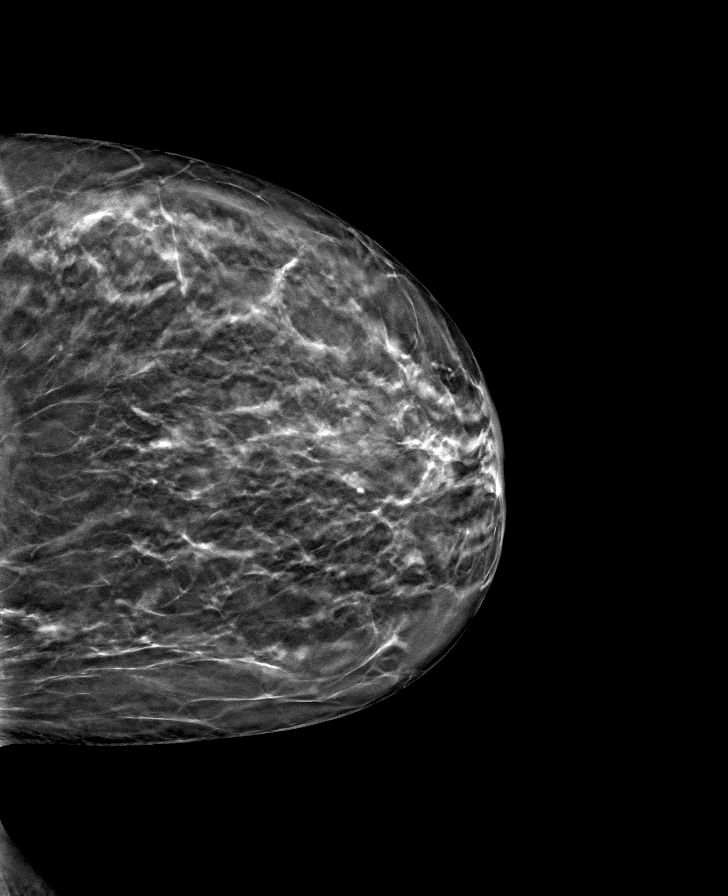

[R CC tomo · tomo slice 36/71.0]
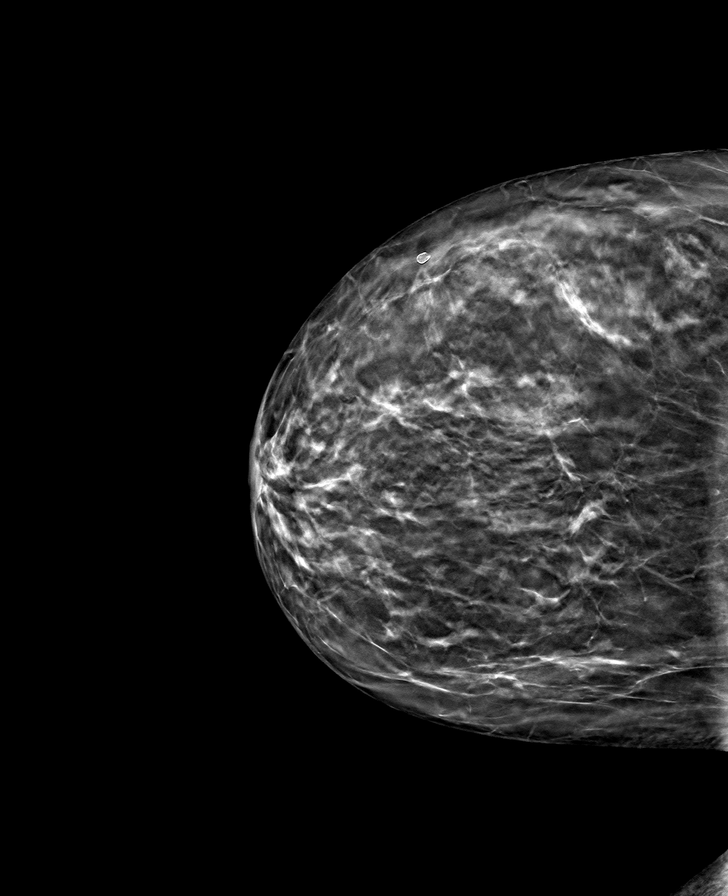

[L MLO tomo · tomo slice 41/80.0]
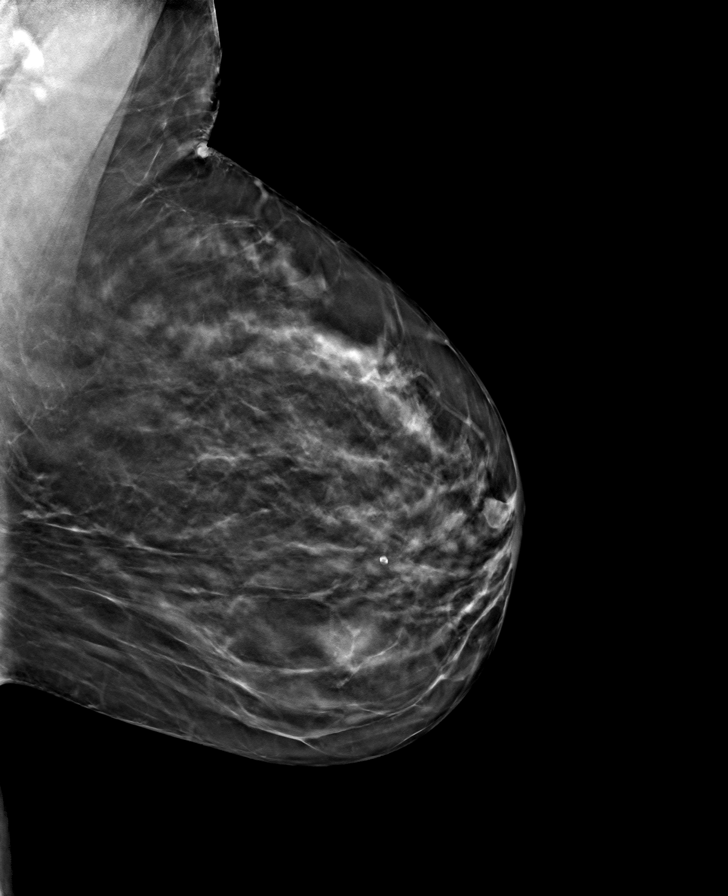

[8 of 24 positions shown; findings below may reference images not displayed]

ACR Breast Density Category c: The breast tissue is heterogeneously
dense, which may obscure small masses.
FINDINGS: There are no findings suspicious for malignancy. The images were
evaluated with computer-aided detection.
IMPRESSION: No mammographic evidence of malignancy. A result letter of this
screening mammogram will be mailed directly to the patient.

RECOMMENDATION:
Screening mammogram in one year. (Code:T4-5-GWO)

BI-RADS CATEGORY  1: Negative.

## 2022-06-21 ENCOUNTER — Other Ambulatory Visit: Payer: Self-pay | Admitting: Internal Medicine

## 2022-06-21 DIAGNOSIS — Z1231 Encounter for screening mammogram for malignant neoplasm of breast: Secondary | ICD-10-CM

## 2022-06-22 ENCOUNTER — Ambulatory Visit
Admission: RE | Admit: 2022-06-22 | Discharge: 2022-06-22 | Disposition: A | Discharge status: Home / Self Care | Payer: Medicare Other | Source: Ambulatory Visit | Attending: Internal Medicine | Admitting: Internal Medicine

## 2022-06-22 DIAGNOSIS — Z1231 Encounter for screening mammogram for malignant neoplasm of breast: Secondary | ICD-10-CM

## 2023-10-08 ENCOUNTER — Other Ambulatory Visit: Payer: Self-pay | Admitting: Internal Medicine

## 2023-10-08 DIAGNOSIS — Z1231 Encounter for screening mammogram for malignant neoplasm of breast: Secondary | ICD-10-CM

## 2023-10-10 ENCOUNTER — Ambulatory Visit

## 2023-12-05 ENCOUNTER — Ambulatory Visit
Admission: RE | Admit: 2023-12-05 | Discharge: 2023-12-05 | Disposition: A | Discharge status: Home / Self Care | Source: Ambulatory Visit | Attending: Internal Medicine | Admitting: Internal Medicine

## 2023-12-05 DIAGNOSIS — Z1231 Encounter for screening mammogram for malignant neoplasm of breast: Secondary | ICD-10-CM
